# Patient Record
Sex: Female | Born: 1935 | Race: White | Hispanic: No | Marital: Married | Smoking: Never smoker
Health system: Southern US, Community
[De-identification: ages and names within clinical notes are randomized; demographics above are authoritative.]

## PROBLEM LIST (undated history)

## (undated) DIAGNOSIS — C50919 Malignant neoplasm of unspecified site of unspecified female breast: Secondary | ICD-10-CM

## (undated) DIAGNOSIS — E785 Hyperlipidemia, unspecified: Secondary | ICD-10-CM

## (undated) DIAGNOSIS — J45909 Unspecified asthma, uncomplicated: Secondary | ICD-10-CM

## (undated) DIAGNOSIS — Z8719 Personal history of other diseases of the digestive system: Secondary | ICD-10-CM

## (undated) DIAGNOSIS — R269 Unspecified abnormalities of gait and mobility: Secondary | ICD-10-CM

## (undated) DIAGNOSIS — R7309 Other abnormal glucose: Secondary | ICD-10-CM

## (undated) DIAGNOSIS — R32 Unspecified urinary incontinence: Secondary | ICD-10-CM

## (undated) DIAGNOSIS — R609 Edema, unspecified: Secondary | ICD-10-CM

## (undated) DIAGNOSIS — F3289 Other specified depressive episodes: Secondary | ICD-10-CM

## (undated) DIAGNOSIS — F068 Other specified mental disorders due to known physiological condition: Secondary | ICD-10-CM

## (undated) DIAGNOSIS — I872 Venous insufficiency (chronic) (peripheral): Secondary | ICD-10-CM

## (undated) DIAGNOSIS — F329 Major depressive disorder, single episode, unspecified: Secondary | ICD-10-CM

## (undated) DIAGNOSIS — E1149 Type 2 diabetes mellitus with other diabetic neurological complication: Secondary | ICD-10-CM

## (undated) DIAGNOSIS — Z8679 Personal history of other diseases of the circulatory system: Secondary | ICD-10-CM

## (undated) HISTORY — DX: Unspecified abnormalities of gait and mobility: R26.9

## (undated) HISTORY — DX: Personal history of other diseases of the circulatory system: Z86.79

## (undated) HISTORY — PX: OTHER SURGICAL HISTORY: SHX169

## (undated) HISTORY — DX: Hyperlipidemia, unspecified: E78.5

## (undated) HISTORY — DX: Unspecified urinary incontinence: R32

## (undated) HISTORY — DX: Unspecified asthma, uncomplicated: J45.909

## (undated) HISTORY — DX: Venous insufficiency (chronic) (peripheral): I87.2

## (undated) HISTORY — DX: Other abnormal glucose: R73.09

## (undated) HISTORY — DX: Personal history of other diseases of the digestive system: Z87.19

## (undated) HISTORY — DX: Malignant neoplasm of unspecified site of unspecified female breast: C50.919

## (undated) HISTORY — DX: Major depressive disorder, single episode, unspecified: F32.9

## (undated) HISTORY — PX: ABDOMINAL HYSTERECTOMY: SHX81

## (undated) HISTORY — DX: Other specified mental disorders due to known physiological condition: F06.8

## (undated) HISTORY — DX: Edema, unspecified: R60.9

## (undated) HISTORY — DX: Type 2 diabetes mellitus with other diabetic neurological complication: E11.49

## (undated) HISTORY — DX: Other specified depressive episodes: F32.89

## (undated) HISTORY — PX: BREAST SURGERY: SHX581

---

## 2003-09-17 HISTORY — PX: BREAST BIOPSY: SHX20

## 2005-09-16 HISTORY — PX: OTHER SURGICAL HISTORY: SHX169

## 2007-09-17 LAB — HM MAMMOGRAPHY: HM Mammogram: NORMAL

## 2008-11-09 ENCOUNTER — Ambulatory Visit: Payer: Self-pay | Admitting: Oncology

## 2008-12-01 LAB — CBC WITH DIFFERENTIAL/PLATELET
BASO%: 0.7 % (ref 0.0–2.0)
EOS%: 2.2 % (ref 0.0–7.0)
HCT: 42.2 % (ref 34.8–46.6)
LYMPH%: 26.4 % (ref 14.0–49.7)
MCH: 31 pg (ref 25.1–34.0)
MCHC: 34 g/dL (ref 31.5–36.0)
NEUT%: 63.3 % (ref 38.4–76.8)
Platelets: 207 10*3/uL (ref 145–400)
RBC: 4.62 10*6/uL (ref 3.70–5.45)
WBC: 6.4 10*3/uL (ref 3.9–10.3)

## 2008-12-01 LAB — COMPREHENSIVE METABOLIC PANEL
ALT: 11 U/L (ref 0–35)
AST: 19 U/L (ref 0–37)
Albumin: 4 g/dL (ref 3.5–5.2)
Alkaline Phosphatase: 50 U/L (ref 39–117)
Calcium: 9.2 mg/dL (ref 8.4–10.5)
Sodium: 135 mEq/L (ref 135–145)
Total Bilirubin: 0.3 mg/dL (ref 0.3–1.2)

## 2008-12-02 LAB — CANCER ANTIGEN 27.29: CA 27.29: 23 U/mL (ref 0–39)

## 2008-12-02 LAB — VITAMIN D 25 HYDROXY (VIT D DEFICIENCY, FRACTURES): Vit D, 25-Hydroxy: 10 ng/mL — ABNORMAL LOW (ref 30–89)

## 2008-12-29 ENCOUNTER — Ambulatory Visit: Payer: Self-pay | Admitting: Endocrinology

## 2008-12-29 ENCOUNTER — Encounter (INDEPENDENT_AMBULATORY_CARE_PROVIDER_SITE_OTHER): Payer: Self-pay | Admitting: *Deleted

## 2008-12-29 ENCOUNTER — Ambulatory Visit: Payer: Self-pay | Admitting: Internal Medicine

## 2008-12-29 DIAGNOSIS — F329 Major depressive disorder, single episode, unspecified: Secondary | ICD-10-CM

## 2008-12-29 DIAGNOSIS — E785 Hyperlipidemia, unspecified: Secondary | ICD-10-CM

## 2008-12-29 DIAGNOSIS — Z8719 Personal history of other diseases of the digestive system: Secondary | ICD-10-CM

## 2008-12-29 DIAGNOSIS — C50919 Malignant neoplasm of unspecified site of unspecified female breast: Secondary | ICD-10-CM | POA: Insufficient documentation

## 2008-12-29 DIAGNOSIS — J45909 Unspecified asthma, uncomplicated: Secondary | ICD-10-CM

## 2008-12-29 DIAGNOSIS — R32 Unspecified urinary incontinence: Secondary | ICD-10-CM

## 2009-01-05 ENCOUNTER — Telehealth: Payer: Self-pay | Admitting: Internal Medicine

## 2009-01-09 ENCOUNTER — Encounter: Payer: Self-pay | Admitting: Internal Medicine

## 2009-01-24 ENCOUNTER — Ambulatory Visit: Payer: Self-pay | Admitting: Gastroenterology

## 2009-01-25 ENCOUNTER — Telehealth: Payer: Self-pay | Admitting: Gastroenterology

## 2009-01-31 ENCOUNTER — Ambulatory Visit: Payer: Self-pay | Admitting: Internal Medicine

## 2009-02-02 ENCOUNTER — Telehealth: Payer: Self-pay | Admitting: Internal Medicine

## 2009-02-09 ENCOUNTER — Telehealth: Payer: Self-pay | Admitting: Internal Medicine

## 2009-02-14 ENCOUNTER — Encounter: Payer: Self-pay | Admitting: Internal Medicine

## 2009-02-20 ENCOUNTER — Encounter: Admission: RE | Admit: 2009-02-20 | Discharge: 2009-02-20 | Payer: Self-pay | Admitting: Neurology

## 2009-05-24 ENCOUNTER — Ambulatory Visit: Payer: Self-pay | Admitting: Internal Medicine

## 2009-05-24 ENCOUNTER — Telehealth: Payer: Self-pay | Admitting: Internal Medicine

## 2009-05-24 DIAGNOSIS — Z8679 Personal history of other diseases of the circulatory system: Secondary | ICD-10-CM | POA: Insufficient documentation

## 2009-05-24 DIAGNOSIS — R609 Edema, unspecified: Secondary | ICD-10-CM | POA: Insufficient documentation

## 2009-05-24 DIAGNOSIS — F0390 Unspecified dementia without behavioral disturbance: Secondary | ICD-10-CM

## 2009-05-25 ENCOUNTER — Encounter (INDEPENDENT_AMBULATORY_CARE_PROVIDER_SITE_OTHER): Payer: Self-pay | Admitting: *Deleted

## 2009-05-25 LAB — CONVERTED CEMR LAB
AST: 19 units/L (ref 0–37)
Albumin: 3.9 g/dL (ref 3.5–5.2)
Alkaline Phosphatase: 49 units/L (ref 39–117)
Basophils Relative: 0.8 % (ref 0.0–3.0)
Eosinophils Relative: 4.4 % (ref 0.0–5.0)
GFR calc non Af Amer: 74.41 mL/min (ref >60)
HCT: 39.6 % (ref 36.0–46.0)
Hemoglobin: 13.9 g/dL (ref 12.0–15.0)
Lymphs Abs: 1.5 10*3/uL (ref 0.7–4.0)
Monocytes Relative: 10.4 % (ref 3.0–12.0)
Neutro Abs: 3.3 10*3/uL (ref 1.4–7.7)
Potassium: 4.9 meq/L (ref 3.5–5.1)
RBC: 4.36 M/uL (ref 3.87–5.11)
Sodium: 144 meq/L (ref 135–145)
TSH: 2.39 microintl units/mL (ref 0.35–5.50)
WBC: 5.6 10*3/uL (ref 4.5–10.5)

## 2009-06-01 ENCOUNTER — Telehealth: Payer: Self-pay | Admitting: Internal Medicine

## 2009-06-09 ENCOUNTER — Encounter: Admission: RE | Admit: 2009-06-09 | Discharge: 2009-06-09 | Payer: Self-pay | Admitting: Oncology

## 2009-06-30 ENCOUNTER — Ambulatory Visit: Payer: Self-pay | Admitting: Oncology

## 2009-07-04 LAB — CBC WITH DIFFERENTIAL/PLATELET
BASO%: 0.3 % (ref 0.0–2.0)
Eosinophils Absolute: 0.2 10*3/uL (ref 0.0–0.5)
LYMPH%: 27.7 % (ref 14.0–49.7)
MCHC: 34.3 g/dL (ref 31.5–36.0)
MONO#: 0.5 10*3/uL (ref 0.1–0.9)
NEUT#: 3.3 10*3/uL (ref 1.5–6.5)
RBC: 4.46 10*6/uL (ref 3.70–5.45)
RDW: 13.3 % (ref 11.2–14.5)
WBC: 5.6 10*3/uL (ref 3.9–10.3)

## 2009-07-04 LAB — COMPREHENSIVE METABOLIC PANEL
ALT: 12 U/L (ref 0–35)
Albumin: 3.8 g/dL (ref 3.5–5.2)
Alkaline Phosphatase: 44 U/L (ref 39–117)
CO2: 29 mEq/L (ref 19–32)
Glucose, Bld: 107 mg/dL — ABNORMAL HIGH (ref 70–99)
Potassium: 4.1 mEq/L (ref 3.5–5.3)
Sodium: 137 mEq/L (ref 135–145)
Total Bilirubin: 0.4 mg/dL (ref 0.3–1.2)
Total Protein: 6.4 g/dL (ref 6.0–8.3)

## 2009-07-05 LAB — VITAMIN D 25 HYDROXY (VIT D DEFICIENCY, FRACTURES): Vit D, 25-Hydroxy: 35 ng/mL (ref 30–89)

## 2009-08-01 ENCOUNTER — Ambulatory Visit: Payer: Self-pay | Admitting: Internal Medicine

## 2009-08-15 ENCOUNTER — Ambulatory Visit: Payer: Self-pay | Admitting: Psychology

## 2009-12-26 ENCOUNTER — Encounter: Payer: Self-pay | Admitting: Internal Medicine

## 2010-01-29 ENCOUNTER — Ambulatory Visit: Payer: Self-pay | Admitting: Oncology

## 2010-01-30 LAB — COMPREHENSIVE METABOLIC PANEL
ALT: 15 U/L (ref 0–35)
Albumin: 3.8 g/dL (ref 3.5–5.2)
BUN: 11 mg/dL (ref 6–23)
CO2: 25 mEq/L (ref 19–32)
Calcium: 9.1 mg/dL (ref 8.4–10.5)
Chloride: 105 mEq/L (ref 96–112)
Creatinine, Ser: 0.79 mg/dL (ref 0.40–1.20)
Potassium: 4 mEq/L (ref 3.5–5.3)

## 2010-01-30 LAB — CBC WITH DIFFERENTIAL/PLATELET
Basophils Absolute: 0 10*3/uL (ref 0.0–0.1)
HCT: 41.9 % (ref 34.8–46.6)
HGB: 14.3 g/dL (ref 11.6–15.9)
MONO#: 0.6 10*3/uL (ref 0.1–0.9)
NEUT#: 4.2 10*3/uL (ref 1.5–6.5)
NEUT%: 59.1 % (ref 38.4–76.8)
WBC: 7.1 10*3/uL (ref 3.9–10.3)
lymph#: 2 10*3/uL (ref 0.9–3.3)

## 2010-01-31 ENCOUNTER — Ambulatory Visit: Payer: Self-pay | Admitting: Internal Medicine

## 2010-01-31 DIAGNOSIS — R269 Unspecified abnormalities of gait and mobility: Secondary | ICD-10-CM | POA: Insufficient documentation

## 2010-02-26 ENCOUNTER — Ambulatory Visit: Payer: Self-pay | Admitting: Internal Medicine

## 2010-03-02 ENCOUNTER — Encounter: Payer: Self-pay | Admitting: Internal Medicine

## 2010-03-05 ENCOUNTER — Ambulatory Visit: Payer: Self-pay | Admitting: Internal Medicine

## 2010-03-06 ENCOUNTER — Telehealth: Payer: Self-pay | Admitting: Internal Medicine

## 2010-03-06 ENCOUNTER — Ambulatory Visit: Payer: Self-pay | Admitting: Internal Medicine

## 2010-03-06 DIAGNOSIS — M7989 Other specified soft tissue disorders: Secondary | ICD-10-CM | POA: Insufficient documentation

## 2010-03-06 DIAGNOSIS — R9431 Abnormal electrocardiogram [ECG] [EKG]: Secondary | ICD-10-CM | POA: Insufficient documentation

## 2010-03-08 ENCOUNTER — Telehealth (INDEPENDENT_AMBULATORY_CARE_PROVIDER_SITE_OTHER): Payer: Self-pay | Admitting: *Deleted

## 2010-03-20 ENCOUNTER — Encounter: Payer: Self-pay | Admitting: Internal Medicine

## 2010-03-21 ENCOUNTER — Ambulatory Visit: Payer: Self-pay

## 2010-03-21 ENCOUNTER — Ambulatory Visit: Payer: Self-pay | Admitting: Cardiology

## 2010-03-21 ENCOUNTER — Ambulatory Visit (HOSPITAL_COMMUNITY): Admission: RE | Admit: 2010-03-21 | Discharge: 2010-03-21 | Payer: Self-pay | Admitting: Internal Medicine

## 2010-03-21 ENCOUNTER — Ambulatory Visit: Payer: Self-pay | Admitting: Internal Medicine

## 2010-03-27 ENCOUNTER — Encounter: Payer: Self-pay | Admitting: Internal Medicine

## 2010-04-02 ENCOUNTER — Encounter: Payer: Self-pay | Admitting: Internal Medicine

## 2010-04-04 ENCOUNTER — Encounter: Payer: Self-pay | Admitting: Internal Medicine

## 2010-04-05 ENCOUNTER — Telehealth: Payer: Self-pay | Admitting: Internal Medicine

## 2010-04-20 ENCOUNTER — Telehealth: Payer: Self-pay | Admitting: Internal Medicine

## 2010-05-22 ENCOUNTER — Encounter: Payer: Self-pay | Admitting: Internal Medicine

## 2010-06-08 ENCOUNTER — Encounter: Payer: Self-pay | Admitting: Internal Medicine

## 2010-07-03 ENCOUNTER — Ambulatory Visit: Payer: Self-pay | Admitting: Internal Medicine

## 2010-07-04 ENCOUNTER — Encounter: Payer: Self-pay | Admitting: Internal Medicine

## 2010-07-12 ENCOUNTER — Encounter: Payer: Self-pay | Admitting: Internal Medicine

## 2010-08-13 ENCOUNTER — Telehealth: Payer: Self-pay | Admitting: Internal Medicine

## 2010-08-16 ENCOUNTER — Telehealth: Payer: Self-pay | Admitting: Internal Medicine

## 2010-08-17 ENCOUNTER — Encounter: Payer: Self-pay | Admitting: Internal Medicine

## 2010-08-21 ENCOUNTER — Encounter: Payer: Self-pay | Admitting: Internal Medicine

## 2010-09-11 ENCOUNTER — Encounter: Payer: Self-pay | Admitting: Internal Medicine

## 2010-09-21 IMAGING — CR DG CHEST 2V
2 series · 2 of 2 positions shown · non-contrast
Comparison: 12/29/2008

CLINICAL DATA: Shortness of breath, left lower extremity edema and
swelling

CHEST - 2 VIEW

[view not recorded (1 of 2)]
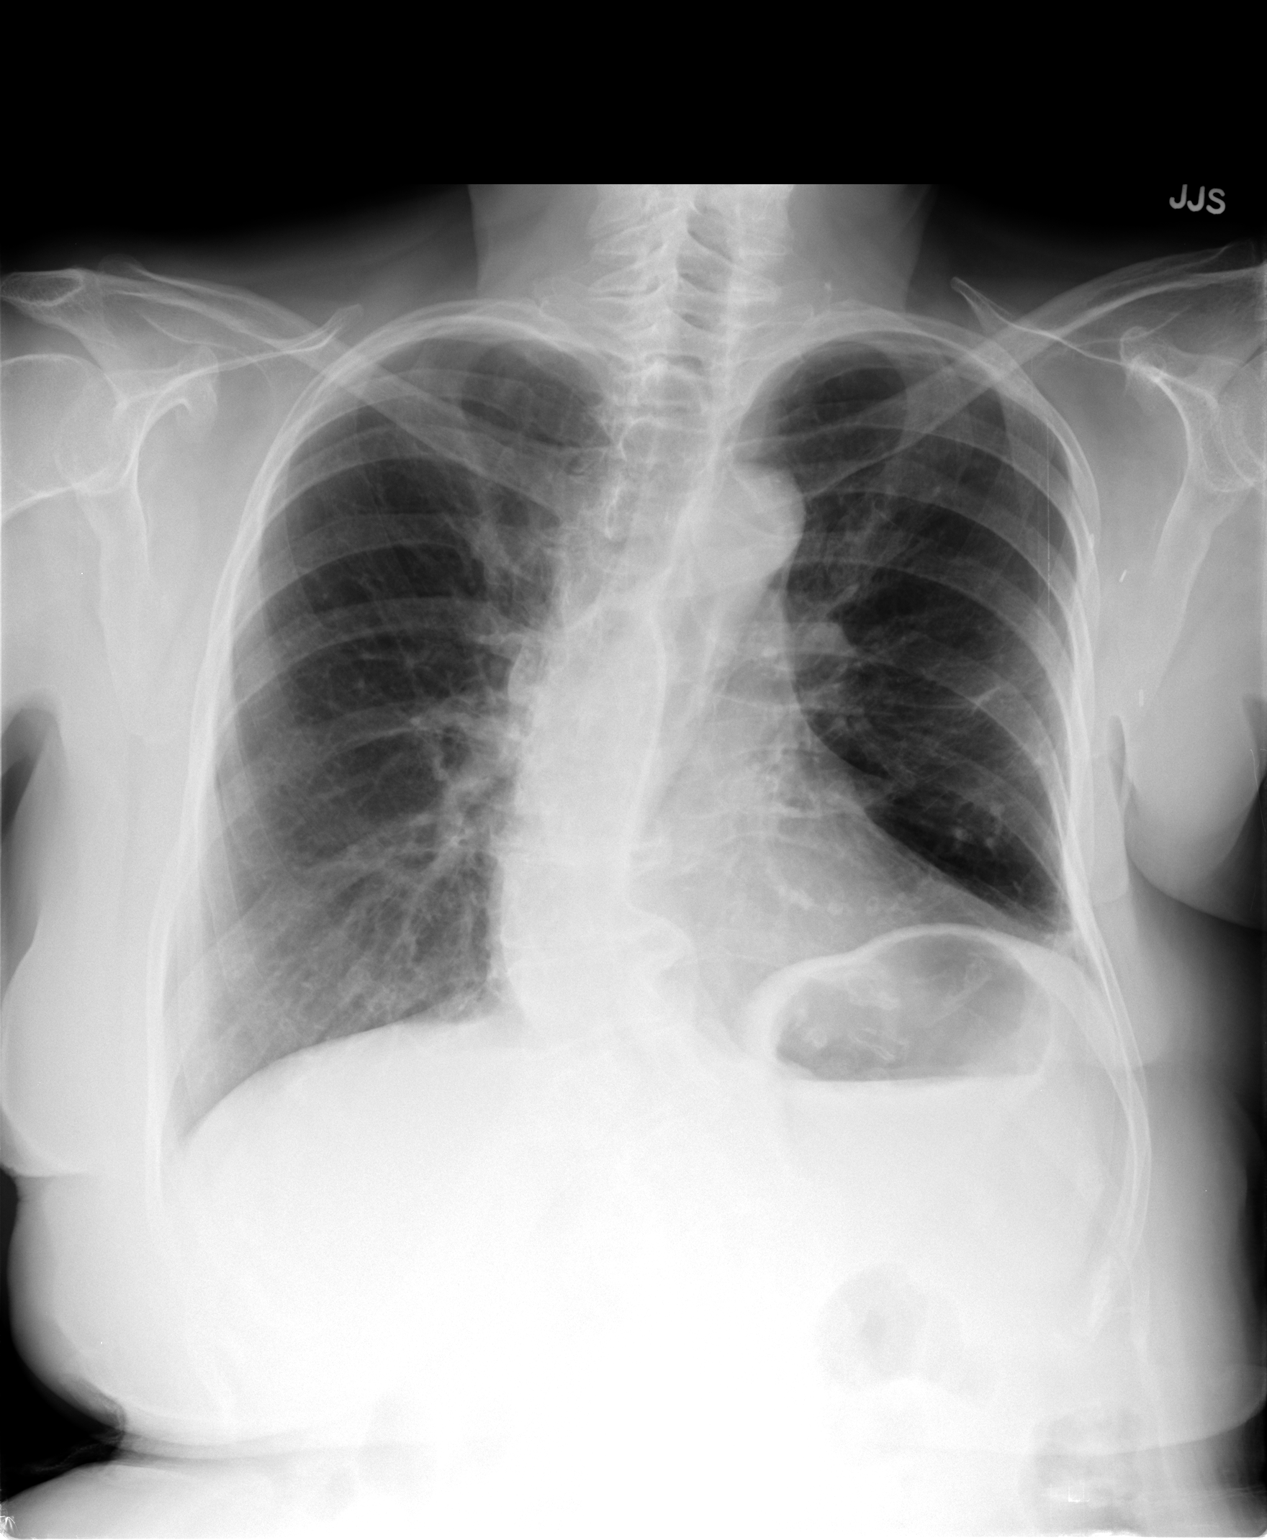

[view not recorded (2 of 2)]
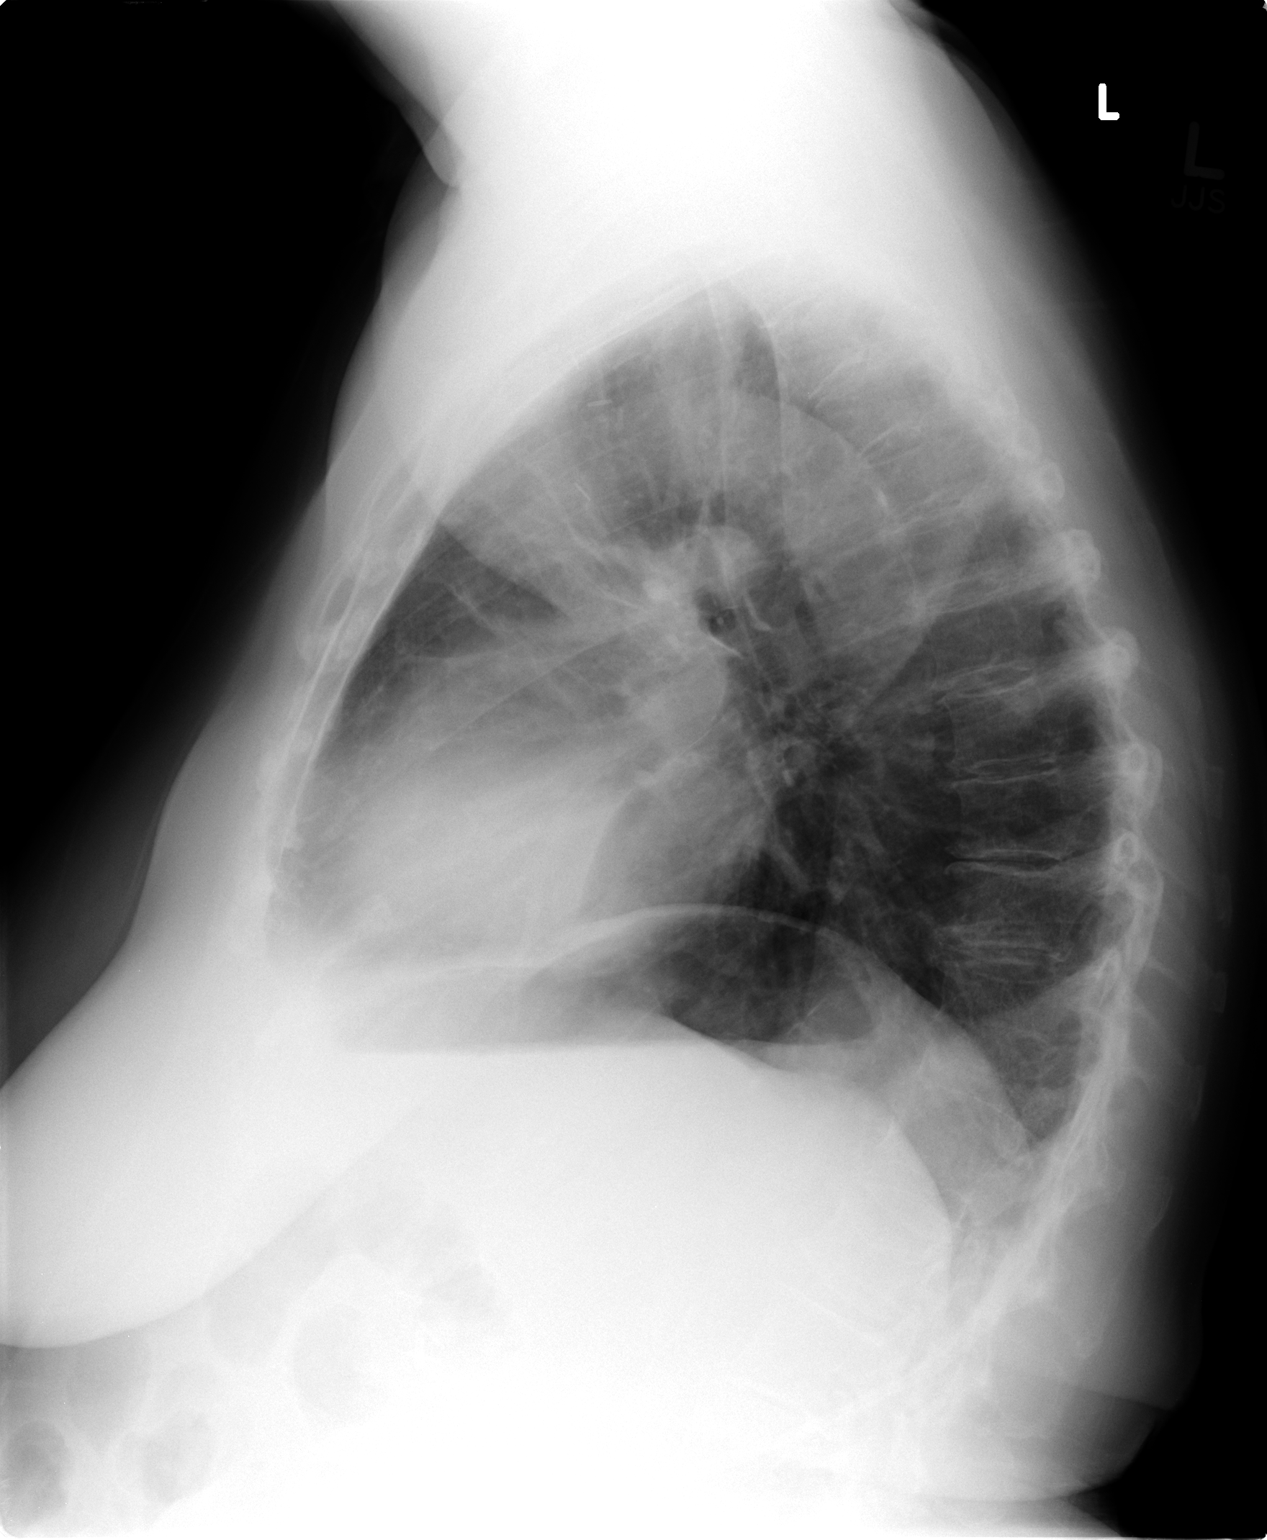

[2 of 2 positions shown; findings below may reference images not displayed]

FINDINGS: Chronic left hemidiaphragmatic elevation and rightward
scoliosis are stable.  Left axillary clips again noted.  Heart size
at upper limits of normal.  No new pulmonary opacity. Bones are
osteopenic, which may mask subtle fracture. No pleural effusion.
Left mid lung zone nodule, presumed granuloma noted, are stable.
IMPRESSION: No new acute finding.

## 2010-10-05 ENCOUNTER — Telehealth: Payer: Self-pay | Admitting: Internal Medicine

## 2010-10-07 ENCOUNTER — Encounter: Payer: Self-pay | Admitting: Oncology

## 2010-10-08 ENCOUNTER — Encounter: Payer: Self-pay | Admitting: Oncology

## 2010-10-14 LAB — CONVERTED CEMR LAB
ALT: 12 units/L (ref 0–35)
AST: 22 units/L (ref 0–37)
Alkaline Phosphatase: 59 units/L (ref 39–117)
Alkaline Phosphatase: 62 units/L (ref 39–117)
BUN: 10 mg/dL (ref 6–23)
Basophils Absolute: 0 10*3/uL (ref 0.0–0.1)
Basophils Relative: 0.7 % (ref 0.0–3.0)
Bilirubin Urine: NEGATIVE
Bilirubin, Direct: 0.1 mg/dL (ref 0.0–0.3)
CO2: 33 meq/L — ABNORMAL HIGH (ref 19–32)
Calcium: 9.3 mg/dL (ref 8.4–10.5)
Calcium: 9.7 mg/dL (ref 8.4–10.5)
Cholesterol: 184 mg/dL (ref 0–200)
Creatinine, Ser: 0.8 mg/dL (ref 0.4–1.2)
Creatinine, Ser: 0.8 mg/dL (ref 0.4–1.2)
Eosinophils Absolute: 0.1 10*3/uL (ref 0.0–0.7)
Eosinophils Relative: 2.5 % (ref 0.0–5.0)
HCT: 43.3 % (ref 36.0–46.0)
Hemoglobin: 14.6 g/dL (ref 12.0–15.0)
Lymphocytes Relative: 24.8 % (ref 12.0–46.0)
MCHC: 33.8 g/dL (ref 30.0–36.0)
MCHC: 34 g/dL (ref 30.0–36.0)
Magnesium: 2.2 mg/dL (ref 1.5–2.5)
Neutro Abs: 5 10*3/uL (ref 1.4–7.7)
Neutrophils Relative %: 63.7 % (ref 43.0–77.0)
Nitrite: NEGATIVE
Pap Smear: NORMAL
Platelets: 184 10*3/uL (ref 150.0–400.0)
Potassium: 4.6 meq/L (ref 3.5–5.1)
RBC: 4.47 M/uL (ref 3.87–5.11)
RBC: 4.72 M/uL (ref 3.87–5.11)
RDW: 12.8 % (ref 11.5–14.6)
RDW: 14.2 % (ref 11.5–14.6)
Specific Gravity, Urine: >=1.03 (ref 1.000–1.030)
Total Bilirubin: 0.3 mg/dL (ref 0.3–1.2)
Total Bilirubin: 0.6 mg/dL (ref 0.3–1.2)
Total Protein: 7.2 g/dL (ref 6.0–8.3)
WBC: 7.2 10*3/uL (ref 4.5–10.5)
pH: 5 (ref 5.0–8.0)

## 2010-10-15 ENCOUNTER — Telehealth: Payer: Self-pay | Admitting: Internal Medicine

## 2010-10-18 NOTE — Progress Notes (Signed)
Summary: Lasix?  Phone Note Call from Patient   Caller: Daughter [phone] Summary of Call: Pt's daughter called stating that swelling in pt's legs have gotten worse. Daughter is requesting an increase in Lasix. Daughter also request Paxil be sent to [facility]. (done 01/31/2010) Initial call taken by: Margaret Pyle, CMA,  March 06, 2010 2:34 PM  Follow-up for Phone Call        ok - increase lasix to once daily - new rx sent to local pharm for same - 90d paxil refill to [facility] done 5/18 (as noted) Follow-up by: Newt Lukes MD,  March 06, 2010 2:43 PM  Additional Follow-up for Phone Call Additional follow up Details #1::        pt's original Rx if for Lasix 20mg  1 by mouth once daily. Okay to increase? Additional Follow-up by: Margaret Pyle, CMA,  March 06, 2010 2:51 PM    Additional Follow-up for Phone Call Additional follow up Details #2::    prior rx was for every other day --- pt now on my schedule for this afternoon - will address at OV - thanks Follow-up by: Newt Lukes MD,  March 06, 2010 3:07 PM  New/Updated Medications: FUROSEMIDE 20 MG TABS (FUROSEMIDE) 1 tab by mouth every day as needed for feet swelling Prescriptions: FUROSEMIDE 20 MG TABS (FUROSEMIDE) 1 tab by mouth every day as needed for feet swelling  #30 x 3   Entered and Authorized by:   Newt Lukes MD   Signed by:   Newt Lukes MD on 03/06/2010   Method used:   Electronically to        [facility]  [phone]* (retail)       [address]       [place], [place]  [address]       Ph: [phone] or [phone]       Fax: [phone]   RxID:   [id number]

## 2010-10-18 NOTE — Progress Notes (Signed)
Summary: vitamin d3  Phone Note Refill Request Message from:  Fax from Pharmacy on August 13, 2010 11:29 AM  Refills Requested: Medication #1:  VITAMIN D 1000 UNIT TABS take 1 by mouth qd   Last Refilled: 07/10/2010 Fax from [facility] [phone] Last ov 07/03/10   Method Requested: Electronic Next Appointment Scheduled: none Initial call taken by: Orlan Leavens RMA,  August 13, 2010 11:29 AM    New/Updated Medications: VITAMIN D3 1000 UNIT TABS (CHOLECALCIFEROL) Take 2 by mouth once daily Prescriptions: VITAMIN D3 1000 UNIT TABS (CHOLECALCIFEROL) Take 2 by mouth once daily  #60 x 6   Entered by:   Orlan Leavens RMA   Authorized by:   Newt Lukes MD   Signed by:   Orlan Leavens RMA on 08/13/2010   Method used:   Electronically to        [facility]* (retail)       [address]       [place], [place]  [address]       Ph: [phone]       Fax: [phone]   RxID:   [id number]

## 2010-10-18 NOTE — Assessment & Plan Note (Signed)
Summary: TB TEST / [facility] Natale Milch  Nurse Visit  CC: TB skin test--Right forearm./kb   Allergies: 1)  ! Codeine  Immunizations Administered:  PPD Skin Test:    Vaccine Type: PPD    Site: left forearm    Mfr: Sanofi Pasteur    Dose: 0.1 ml    Route: ID    Given by: Lucious Groves    Exp. Date: 02/11/2012    Lot #: [id number]  Orders Added: 1)  TB Skin Test [86580] 2)  Admin 1st Vaccine [90471]   Received fax from [facility] that TB was negative on 03-23-10. Lucious Groves CMA  April 09, 2010 10:25 AM

## 2010-10-18 NOTE — Miscellaneous (Signed)
Summary: Plan/[facility]  Plan/[facility]   Imported By: Lester [place]land 02/28/2010 10:57:59  _____________________________________________________________________  External Attachment:    Type:   Image     Comment:   External Document

## 2010-10-18 NOTE — Miscellaneous (Signed)
Summary: Order/[facility]  Order/[facility]   Imported By: Lester [place] 03/29/2010 08:17:57  _____________________________________________________________________  External Attachment:    Type:   Image     Comment:   External Document

## 2010-10-18 NOTE — Miscellaneous (Signed)
Summary: Medication orders/[facility]  Medication orders/ Place   Imported By: Sherian Rein 08/23/2010 11:43:26  _____________________________________________________________________  External Attachment:    Type:   Image     Comment:   External Document

## 2010-10-18 NOTE — Miscellaneous (Signed)
Summary: Medication order/[facility]  Medication order/[facility]   Imported By: Sherian Rein 07/16/2010 07:36:19  _____________________________________________________________________  External Attachment:    Type:   Image     Comment:   External Document

## 2010-10-18 NOTE — Assessment & Plan Note (Signed)
Summary: FEET ARE SWELLING/[facility]   Vital Signs:  Patient profile:   77 year old female Height:      58 inches (147.32 cm) Weight:      142.8 pounds (65.05 kg) O2 Sat:      91 % on Room air Temp:     97.2 degrees F (36.22 degrees C) oral Pulse rate:   94 / minute BP sitting:   132 / 60  (right arm) Cuff size:   regular  Vitals Entered By: Orlan Leavens (March 06, 2010 3:39 PM)  O2 Flow:  Room air CC: swelling in her feet Comments Req rx for paroxetine to be sent to [facility]. daughter states no loner want to get from local pharm   Primary Care Provider:  Newt Lukes MD  CC:  swelling in her feet.  History of Present Illness: here for edema - chronic but getting worse even since OV 1 month ago- weight up only a little - L>R leg swelling "like it might pop" not improving with Lasix every other day or once daily  +DOE but no CP or cough - no medication changes no hx CHF or DVT  reveiw of 01/31/10 OV today: 1) dementia - following with local neuro and [facility] - recent PET scan showing "regular" ALz typenow on aricept for same and tol well - no adv se family preparing for ALF needs - planning to move in mid Jul 2011 reports on generic paxil, overall irritability is much improved  2) breast ca hx - 5 years ca free this Sept - plans to cont med tx thru 01/2010 (57yr post chemo done) - now clear to stop tamoxifen folowing with onc for same  3) weight gain - over 20 lbs since moving here - pt attributes to antidepressant meds - reports she does not participate in any exercise - diet predominately simple carbs and sweets no bowel changes  4) ankle edema - kept under control with every other day lasix - no leg cramping - no SOB  5) depression - see dementia above - reports compliance with ongoing medical treatment and no changes in medication dose or frequency. denies adverse side effects related to current therapy.  less irritable, less sad, no tearfulness - family  agrees    Clinical Review Panels:  Immunizations   Last Tetanus Booster:  Td (12/29/2008)   Last Flu Vaccine:  Historical (06/16/2009)   Last Pneumovax:  Historical (09/17/2007)  Lipid Management   Cholesterol:  184 (12/29/2008)   LDL (bad choesterol):  116 (12/29/2008)   HDL (good cholesterol):  52.70 (12/29/2008)  CBC   WBC:  5.6 (05/24/2009)   RBC:  4.36 (05/24/2009)   Hgb:  13.9 (05/24/2009)   Hct:  39.6 (05/24/2009)   Platelets:  189.0 (05/24/2009)   MCV  90.8 (05/24/2009)   MCHC  35.1 (05/24/2009)   RDW  12.9 (05/24/2009)   PMN:  57.4 (05/24/2009)   Lymphs:  27.0 (05/24/2009)   Monos:  10.4 (05/24/2009)   Eosinophils:  4.4 (05/24/2009)   Basophil:  0.8 (05/24/2009)  Complete Metabolic Panel   Glucose:  92 (05/24/2009)   Sodium:  144 (05/24/2009)   Potassium:  4.9 (05/24/2009)   Chloride:  107 (05/24/2009)   CO2:  30 (05/24/2009)   BUN:  10 (05/24/2009)   Creatinine:  0.8 (05/24/2009)   Albumin:  3.9 (05/24/2009)   Total Protein:  6.6 (05/24/2009)   Calcium:  9.2 (05/24/2009)   Total Bili:  0.7 (05/24/2009)   Alk Phos:  49 (05/24/2009)   SGPT (ALT):  10 (05/24/2009)   SGOT (AST):  19 (05/24/2009)   Allergies: 1)  ! Codeine  Past History:  Past medical, surgical, family and social histories (including risk factors) reviewed, and no changes noted (except as noted below).  Past Medical History: dementia - alzheimers breast cancer - 05/31/2004 - 2 lumps removed L breast, s/p XRT and chemo  TIA - 2005 ITP -1991 Asthma Depression Diverticulitis, hx of dyslipidemia - declined med tx   MD roster: onc - [facility] - [facility]  Past Surgical History: Reviewed history from 12/29/2008 and no changes required. Cholecystectomy (6789) Hysterectomy (1972) Breast biopsy (2005) Urinary sling (2007) ITP (1991 [facility])  Family History: Reviewed history from 12/29/2008 and no changes  required. Family History Breast cancer 1st degree relative <50 (mother) Family History Kidney disease (mother) Family History of Prostate CA 1st degree relative <50 (parent) Family History of Stroke F 1st degree relative <60 (mother) Heart disease (father) 2 uncle commited suicide Father electric shock therapy  Social History: Reviewed history from 12/29/2008 and no changes required. lives in [place] with spouse. Remote tobacco No alcohol use.  Review of Systems  The patient denies anorexia, chest pain, syncope, headaches, abdominal pain, incontinence, and difficulty walking.         also, see HPI above. I have reviewed all other systems and they were negative.   Physical Exam  General:  alert, well-developed, well-nourished, and cooperative to examination.   dtr at side Lungs:  normal respiratory effort with moderate air mvmt, no intercostal retractions or use of accessory muscles; normal breath sounds bilaterally - no crackles and no wheezes.    Heart:  normal rate, regular rhythm, no murmur, and no rub. BLE with ++ edema, L>R. normal DP pulses and normal cap refill in all 4 extremities    Skin:  red line of cracking skin line due to edema at left lateral ankle - no ulceration or cellulitis Psych:  Oriented X3, memory intact for recent < remote, normally interactive, good eye contact, not anxious appearing, not depressed appearing, and not agitated.      Impression & Recommendations:  Problem # 1:  EDEMA (ICD-782.3)  increase Lasix +add potassium while eval for underlying cause - check labs, CXR, EKG r/o cardiac event or CHF check doppler r/o DVT, esp with cancer hx Her updated medication list for this problem includes:    Lasix 40 Mg Tabs (Furosemide) .Marland Kitchen... 1 by mouth two times a day x 3 days, then 1 by mouth once daily (or as directed)  Orders: EKG w/ Interpretation (93000) T-2 View CXR (71020TC) TLB-BMP (Basic Metabolic Panel-BMET) (80048-METABOL) TLB-CBC Platelet  - w/Differential (85025-CBCD) TLB-Hepatic/Liver Function Pnl (80076-HEPATIC) TLB-TSH (Thyroid Stimulating Hormone) (84443-TSH) TLB-BNP (B-Natriuretic Peptide) (83880-BNPR) Misc. Referral (Misc. Ref) Echo Referral (Echo) TLB-Magnesium (Mg) (83735-MG) Prescription Created Electronically [phone])  note EKG today shows new prolonged QTc as compared to 12/29/2008 EKG - no offending meds on current list - check lytes as above and cont eval for CHF or structural heart dz as planned  Problem # 2:  NONSPECIFIC ABNORMAL ELECTROCARDIOGRAM (ICD-794.31) new prolonged QTC since 12/29/2008 EKG - see discussion above workup and eval as discussed above Orders: TLB-Magnesium (Mg) (83735-MG)  Problem # 3:  SWELLING OF LIMB (ICD-729.81)  Orders: TLB-BMP (Basic Metabolic Panel-BMET) (80048-METABOL) TLB-CBC Platelet - w/Differential (85025-CBCD) TLB-Hepatic/Liver Function Pnl (80076-HEPATIC) TLB-TSH (Thyroid Stimulating Hormone) (84443-TSH) TLB-BNP (B-Natriuretic Peptide) (83880-BNPR) Misc. Referral (Misc. Ref) Echo Referral (Echo) Prescription  Created Electronically [phone])  Problem # 4:  DEPRESSION (ICD-311) reviewed for poss SE - not on TCAs or known to cause above problems refill to [facility] as discussed symptoms including dementia symptoms are much improved with tx as ongoing - cont same Her updated medication list for this problem includes:    Paroxetine Hcl 20 Mg Tabs (Paroxetine hcl) .Marland Kitchen... 1 by mouth once daily    Remeron 30 Mg Tabs (Mirtazapine) .Marland Kitchen... Take 2 at bedtime  Complete Medication List: 1)  Nitrofurantoin Macrocrystal 50 Mg Caps (Nitrofurantoin macrocrystal) .Marland Kitchen.. 1 by mouth at bedtime 2)  Meclizine Hcl 25 Mg Tabs (Meclizine hcl) .Marland Kitchen.. 1 by mouth at bedtime 3)  Gabapentin 100 Mg Caps (Gabapentin) .Marland Kitchen.. 1 by mouth at bedtime 4)  Caltrate 600+d Plus 600-400 Mg-unit Tabs (Calcium carbonate-vit d-min) .Marland Kitchen.. 1 by mouth once daily 5)  Paroxetine Hcl 20 Mg Tabs (Paroxetine hcl) .Marland Kitchen.. 1 by mouth  once daily 6)  Aspirin 81 Mg Tabs (Aspirin) .... Take 1 by mouth qd 7)  Remeron 30 Mg Tabs (Mirtazapine) .... Take 2 at bedtime 8)  Vitamin D 2000 Unit Tabs (Cholecalciferol) .... Take 1 by mouth qd 9)  Aricept 10 Mg Tabs (Donepezil hcl) .... Take 1 by mouth once daily 10)  Lasix 40 Mg Tabs (Furosemide) .Marland Kitchen.. 1 by mouth two times a day x 3 days, then 1 by mouth once daily (or as directed) 11)  Klor-con M20 20 Meq Cr-tabs (Potassium chloride crys cr) .Marland Kitchen.. 1 by mouth two times a day x 3 days, then 1 by mouth once daily or as directed (take with every 40mg  lasix dose)  Patient Instructions: 1)  it was good to see you today. 2)  increase lasix and add potassium as discussed - see below - your prescriptions have been electronically submitted to your local pharmacy. Please take as directed. Contact our office if you believe you're having problems with the medication(s).  3)  test(s) ordered today - your results will be posted on the phone tree for review in 48-72 hours from the time of test completion; call [phone] and enter your 9 digit MRN (listed above on this page, just below your name); if any changes need to be made or there are abnormal results, you will be contacted directly.  4)  we'll make referral for doppler to look for blood clot and echocardiogram to look at heart pump function. Our office will contact you regarding this appointment once made.  Prescriptions: KLOR-CON M20 20 MEQ CR-TABS (POTASSIUM CHLORIDE CRYS CR) 1 by mouth two times a day x 3 days, then 1 by mouth once daily or as directed (take with every 40mg  lasix dose)  #40 x 1   Entered and Authorized by:   Newt Lukes MD   Signed by:   Newt Lukes MD on 03/06/2010   Method used:   Electronically to        [facility]  [phone]* (retail)       [address]       [place], [place]  [address]       Ph: [phone] or [phone]       Fax: [phone]   RxID:    [id number] LASIX 40 MG TABS (FUROSEMIDE) 1 by mouth two times a day x 3 days, then 1 by mouth once daily (or as directed)  #40 x 1   Entered and Authorized by:   Newt Lukes MD  Signed by:   Newt Lukes MD on 03/06/2010   Method used:   Electronically to        [facility]  [phone]* (retail)       [address]       [place], [place]  [address]       Ph: [phone] or [phone]       Fax: [phone]   RxID:   [id number] PAROXETINE HCL 20 MG TABS (PAROXETINE HCL) 1 by mouth once daily  #90 x 3   Entered by:   Orlan Leavens   Authorized by:   Newt Lukes MD   Signed by:   Orlan Leavens on 03/06/2010   Method used:   Faxed to ...       [facility] (mail-order)             , [place]         Ph: [phone]       Fax: [phone]   RxID:   [id number]

## 2010-10-18 NOTE — Miscellaneous (Signed)
Summary: Orders/[facility]  Orders/[facility]   Imported By: Lester Tiffin 04/06/2010 09:52:37  _____________________________________________________________________  External Attachment:    Type:   Image     Comment:   External Document

## 2010-10-18 NOTE — Assessment & Plan Note (Signed)
Summary: ASTHMA  PER DAHLIA SCHED  STC   Vital Signs:  Patient profile:   77 year old female Height:      58 inches (147.32 cm) Weight:      160.4 pounds (72.91 kg) O2 Sat:      94 % on Room air Temp:     98.0 degrees F (36.67 degrees C) oral Pulse rate:   113 / minute BP sitting:   142 / 72  (right arm) Cuff size:   regular  Vitals Entered By: Orlan Leavens RMA (July 03, 2010 9:35 AM)  O2 Flow:  Room air CC: Asthma Is Patient Diabetic? No Pain Assessment Patient in pain? no        Primary Care Provider:  Newt Lukes MD  CC:  Asthma.  History of Present Illness: c/o asthma flare: hx same prior to move to [place] (2008)  onset current symptoms 1 week ago a/w SOB, DOE and wheeze not a/w fever or sputum, no PND not changed by position but worse with exertion  also reviewed chronic med issues: 1) dementia - following with local neuro and [facility] - recent PET scan showing "regular" ALz type, now on aricept for same and tol well - no adv se in ALF since Jul 2011 reports on generic paxil, overall irritability is much improved  2) breast ca hx - 5 years ca free this Sept - plans to cont med tx thru 01/2010 (27yr post chemo done) - now clear to stop tamoxifen folowing with onc for same  3) weight gain - over 30 lbs since moving here - pt attributes to antidepressant meds - reports she does not participate in any exercise - diet predominately simple carbs and sweets no bowel changes  4) ankle edema - kept under control with lasix - eval summer 2011 neg for cardiac or liver or kidney problems -no leg cramping - no SOB  5) depression - see dementia above - reports compliance with ongoing medical treatment and no changes in medication dose or frequency. denies adverse side effects related to current therapy.  less irritable, less sad, no tearfulness - family agrees    Clinical Review Panels:  Immunizations   Last Tetanus Booster:  Td (12/29/2008)   Last Flu Vaccine:   Historical (06/16/2009)   Last Pneumovax:  Historical (09/17/2007)  CBC   WBC:  7.3 (03/06/2010)   RBC:  4.47 (03/06/2010)   Hgb:  13.9 (03/06/2010)   Hct:  40.7 (03/06/2010)   Platelets:  211.0 (03/06/2010)   MCV  91.1 (03/06/2010)   MCHC  34.0 (03/06/2010)   RDW  14.2 (03/06/2010)   PMN:  63.7 (03/06/2010)   Lymphs:  24.8 (03/06/2010)   Monos:  8.7 (03/06/2010)   Eosinophils:  2.1 (03/06/2010)   Basophil:  0.7 (03/06/2010)  Complete Metabolic Panel   Glucose:  115 (03/06/2010)   Sodium:  144 (03/06/2010)   Potassium:  4.0 (03/06/2010)   Chloride:  103 (03/06/2010)   CO2:  33 (03/06/2010)   BUN:  15 (03/06/2010)   Creatinine:  0.8 (03/06/2010)   Albumin:  3.8 (03/06/2010)   Total Protein:  6.9 (03/06/2010)   Calcium:  9.3 (03/06/2010)   Total Bili:  0.3 (03/06/2010)   Alk Phos:  62 (03/06/2010)   SGPT (ALT):  12 (03/06/2010)   SGOT (AST):  18 (03/06/2010)   Current Medications (verified): 1)  Nitrofurantoin Macrocrystal 50 Mg Caps (Nitrofurantoin Macrocrystal) .Marland Kitchen.. 1 By Mouth At Bedtime 2)  Meclizine Hcl 25 Mg Tabs (Meclizine  Hcl) .... 1 By Mouth At Bedtime 3)  Caltrate 600+d Plus 600-400 Mg-Unit Tabs (Calcium Carbonate-Vit D-Min) .Marland Kitchen.. 1 By Mouth Once Daily 4)  Paroxetine Hcl 20 Mg Tabs (Paroxetine Hcl) .Marland Kitchen.. 1 By Mouth Once Daily 5)  Aspirin 81 Mg Tabs (Aspirin) .... Take 1 By Mouth Qd 6)  Remeron 30 Mg Tabs (Mirtazapine) .... Take 2 At Bedtime 7)  Vitamin D 2000 Unit Tabs (Cholecalciferol) .... Take 1 By Mouth Qd 8)  Aricept 10 Mg Tabs (Donepezil Hcl) .... Take 1 By Mouth Once Daily 9)  Lasix 40 Mg Tabs (Furosemide) .Marland Kitchen.. 1 By Mouth Once Daily (Or As Directed) 10)  Klor-Con M20 20 Meq Cr-Tabs (Potassium Chloride Crys Cr) .Marland Kitchen.. 1 By Mouth Once Daily or As Directed (Take With Every 40mg  Lasix Dose) 11)  Gabapentin 300 Mg Caps (Gabapentin) .... Take 1 At Bedtime  Allergies (verified): 1)  ! Codeine  Past History:  Past Medical History: dementia - alzheimers breast  cancer - 05/31/2004 - 2 lumps removed L breast, s/p XRT and chemo  TIA - 2005 ITP -1991 Asthma Depression Diverticulitis, hx of dyslipidemia - declined med tx   MD roster:  onc - [facility] neuro - [facility]  Review of Systems       The patient complains of weight gain and peripheral edema.  The patient denies anorexia, fever, chest pain, and hemoptysis.    Physical Exam  General:  alert, well-developed, well-nourished, and cooperative to examination.   dtr at side Lungs:  normal respiratory effort with moderate air mvmt, no intercostal retractions or use of accessory muscles; good breath sounds bilaterally - no crackles but soft end exp wheezes.    Heart:  normal rate, regular rhythm, no murmur, and no rub. BLE with trace edema, L>R.    Impression & Recommendations:  Problem # 1:  ASTHMA (ICD-493.90)  acute exac, mild - prev on maintence steroids and as needed duoneb  change to MDI now and resume tx -  no signs infx at this time but to call if fever or sputum or not improved consider need for PFTs in future if cont symptoms  Her updated medication list for this problem includes:    Qvar 80 Mcg/act Aers (Beclomethasone dipropionate) .Marland Kitchen... 2 pufss two times a day everyday    Combivent 18-103 Mcg/act Aero (Ipratropium-albuterol) .Marland Kitchen... 2 puffs three times a day x 7 days, then as needed for shortness of breath symptoms  Pulmonary Functions Reviewed: O2 sat: 94 (07/03/2010)  Problem # 2:  DEMENTIA (ICD-294.8)  cont ongoing care and w/u med mgmt by neuro -  symptoms mgmt with paxil and remeron reviewed, now on aricept as well- support offered suggested reduction in remeron dose esp with edema and cont weight gain - dtr will discuss with neuro before making med changes  Complete Medication List: 1)  Nitrofurantoin Macrocrystal 50 Mg Caps (Nitrofurantoin macrocrystal) .Marland Kitchen.. 1 by mouth at bedtime 2)  Meclizine Hcl 25 Mg Tabs (Meclizine hcl) .Marland Kitchen.. 1 by mouth at bedtime 3)   Caltrate 600+d Plus 600-400 Mg-unit Tabs (Calcium carbonate-vit d-min) .Marland Kitchen.. 1 by mouth once daily 4)  Paroxetine Hcl 20 Mg Tabs (Paroxetine hcl) .Marland Kitchen.. 1 by mouth once daily 5)  Aspirin 81 Mg Tabs (Aspirin) .... Take 1 by mouth qd 6)  Remeron 30 Mg Tabs (Mirtazapine) .... Take 2 at bedtime 7)  Vitamin D 2000 Unit Tabs (Cholecalciferol) .... Take 1 by mouth qd 8)  Aricept 10 Mg Tabs (Donepezil hcl) .... Take 1 by mouth once  daily 9)  Lasix 40 Mg Tabs (Furosemide) .Marland Kitchen.. 1 by mouth once daily (or as directed) 10)  Klor-con M20 20 Meq Cr-tabs (Potassium chloride crys cr) .Marland Kitchen.. 1 by mouth once daily or as directed (take with every 40mg  lasix dose) 11)  Gabapentin 300 Mg Caps (Gabapentin) .... Take 1 at bedtime 12)  Qvar 80 Mcg/act Aers (Beclomethasone dipropionate) .... 2 pufss two times a day everyday 13)  Combivent 18-103 Mcg/act Aero (Ipratropium-albuterol) .... 2 puffs three times a day x 7 days, then as needed for shortness of breath symptoms  Patient Instructions: 1)  it was good to see you today. 2)  start Qvar (like old pink inhaler) and Combive nt (like old nebulizer  medication) - your prescriptions have been electronically submitted to [facility] AND given to you to submit to your local pharmacy. Please take as directed. Contact our office if you believe you're having problems with the medication(s).  3)  You should keep both these inhalers in your room to use as we discussed - please let your facility know i have ordered same and to contact me if they have questions or concerns 4)  if you develop worsening symptoms or fever, call us and we can reconsider antibiotics but it does not appear necessary to use any anitbiotic at this time  5)  discuss with neuro about possible reduction in remeron due to swelling and weight gain - ?other medication to use 6)  Please schedule a follow-up appointment in 3-6 months, sooner if problems.  Prescriptions: COMBIVENT 18-103 MCG/ACT AERO (IPRATROPIUM-ALBUTEROL) 2  puffs three times a day x 7 days, then as needed for shortness of breath symptoms  #3 x 3   Entered and Authorized by:   Newt Lukes MD   Signed by:   Newt Lukes MD on 07/03/2010   Method used:   Faxed to ...       [facility] (mail-order)             , [place]         Ph: [phone]       Fax: [phone]   RxID:   [id number] QVAR 80 MCG/ACT AERS (BECLOMETHASONE DIPROPIONATE) 2 pufss two times a day everyday  #3 x 3   Entered and Authorized by:   Newt Lukes MD   Signed by:   Newt Lukes MD on 07/03/2010   Method used:   Faxed to ...       [facility] (mail-order)             , [place]         Ph: [phone]       Fax: [phone]   RxID:   [id number] COMBIVENT 18-103 MCG/ACT AERO (IPRATROPIUM-ALBUTEROL) 2 puffs three times a day x 7 days, then as needed for shortness of breath symptoms  #1 x 1   Entered and Authorized by:   Newt Lukes MD   Signed by:   Newt Lukes MD on 07/03/2010   Method used:   Print then Give to Patient   RxID:   [id number] QVAR 80 MCG/ACT AERS (BECLOMETHASONE DIPROPIONATE) 2 pufss two times a day everyday  #1 x 1   Entered and Authorized by:   Newt Lukes MD   Signed by:   Newt Lukes MD on 07/03/2010   Method used:   Print then Give to Patient   RxID:   [id number]    Orders Added: 1)  Est. Patient  Level IV [id number]

## 2010-10-18 NOTE — Miscellaneous (Signed)
Summary: Resident Register & inof/[facility]  Resident Register [facility] inof/[facility]   Imported By: Sherian Rein 09/18/2010 14:23:28  _____________________________________________________________________  External Attachment:    Type:   Image     Comment:   External Document

## 2010-10-18 NOTE — Miscellaneous (Signed)
Summary: Orders Update  Clinical Lists Changes  Orders: Added new Test order of Venous Duplex Lower Extremity (Venous Duplex Lower) - Signed 

## 2010-10-18 NOTE — Assessment & Plan Note (Signed)
Summary: 6 mos f/u per daughter/#?cd   Vital Signs:  Patient profile:   77 year old female Height:      58 inches (147.32 cm) Weight:      142.8 pounds (64.91 kg) BMI:     29.95 O2 Sat:      94 % on Room air Temp:     98.1 degrees F (36.72 degrees C) oral Pulse rate:   99 / minute BP sitting:   134 / 72  (right arm) Cuff size:   regular  Vitals Entered By: Orlan Leavens (Jan 31, 2010 9:57 AM)  O2 Flow:  Room air CC: 6 month follow-up/ Need 90 day on meclizine & furosemide Is Patient Diabetic? No Pain Assessment Patient in pain? no        Primary Care Provider:  Newt Lukes MD  CC:  6 month follow-up/ Need 90 day on meclizine & furosemide.  History of Present Illness: here for followup  1) dementia - following with local neuro and [facility] - recent PET scan showing "regular" ALz typenow on aricept for same and tol well - no adv se family preparing for ALF needs - ?increase generic paxil dose though overall irritability is much improved  2) breast ca hx - 5 years ca free this Sept - plans to cont med tx thru 01/2010 (13yr post chemo done) - now clear to stop tamoxifen folowing with onc for same  3) weight gain - over 20 lbs since moving here - pt attributes to antidepressant meds - reports she does not participate in any exercise - diet predominately simple carbs and sweets no bowel changes  4) ankle edema - kept under control with every other day lasix - no leg cramping - no SOB  5) depression - see dementia above - reports compliance with ongoing medical treatment and no changes in medication dose or frequency. denies adverse side effects related to current therapy.  less irritable, less sad, no tearfulness - family agrees    Clinical Review Panels:  Immunizations   Last Tetanus Booster:  Td (12/29/2008)   Last Flu Vaccine:  Historical (06/16/2009)   Last Pneumovax:  Historical (09/17/2007)  Lipid Management   Cholesterol:  184 (12/29/2008)   LDL (bad  choesterol):  116 (12/29/2008)   HDL (good cholesterol):  52.70 (12/29/2008)  CBC   WBC:  5.6 (05/24/2009)   RBC:  4.36 (05/24/2009)   Hgb:  13.9 (05/24/2009)   Hct:  39.6 (05/24/2009)   Platelets:  189.0 (05/24/2009)   MCV  90.8 (05/24/2009)   MCHC  35.1 (05/24/2009)   RDW  12.9 (05/24/2009)   PMN:  57.4 (05/24/2009)   Lymphs:  27.0 (05/24/2009)   Monos:  10.4 (05/24/2009)   Eosinophils:  4.4 (05/24/2009)   Basophil:  0.8 (05/24/2009)  Complete Metabolic Panel   Glucose:  92 (05/24/2009)   Sodium:  144 (05/24/2009)   Potassium:  4.9 (05/24/2009)   Chloride:  107 (05/24/2009)   CO2:  30 (05/24/2009)   BUN:  10 (05/24/2009)   Creatinine:  0.8 (05/24/2009)   Albumin:  3.9 (05/24/2009)   Total Protein:  6.6 (05/24/2009)   Calcium:  9.2 (05/24/2009)   Total Bili:  0.7 (05/24/2009)   Alk Phos:  49 (05/24/2009)   SGPT (ALT):  10 (05/24/2009)   SGOT (AST):  19 (05/24/2009)   Current Medications (verified): 1)  Nitrofurantoin Macrocrystal 50 Mg Caps (Nitrofurantoin Macrocrystal) .Marland Kitchen.. 1 By Mouth At Bedtime 2)  Meclizine Hcl 25 Mg Tabs (Meclizine Hcl) .Marland KitchenMarland KitchenMarland Kitchen  1 By Mouth At Bedtime 3)  Gabapentin 100 Mg Caps (Gabapentin) .Marland Kitchen.. 1 By Mouth At Bedtime 4)  Caltrate 600+d Plus 600-400 Mg-Unit Tabs (Calcium Carbonate-Vit D-Min) .Marland Kitchen.. 1 By Mouth Once Daily 5)  Paroxetine Hcl 10 Mg Tabs (Paroxetine Hcl) .Marland Kitchen.. 1 By Mouth At Bedtime 6)  Aspirin 81 Mg Tabs (Aspirin) .... Take 1 By Mouth Qd 7)  Remeron 30 Mg Tabs (Mirtazapine) .... Take 2 At Bedtime 8)  Furosemide 20 Mg Tabs (Furosemide) .Marland Kitchen.. 1 Tab By Mouth Every Other Day As Needed For Feet Swelling 9)  Vitamin D 2000 Unit Tabs (Cholecalciferol) .... Take 1 By Mouth Qd 10)  Aricept 10 Mg Tabs (Donepezil Hcl) .... Take 1 By Mouth Once Daily  Allergies (verified): 1)  ! Codeine  Past History:  Past Medical History: dementia - alzheimers breast cancer - 05/31/2004 - 2 lumps removed L breast, s/p XRT and chemo  TIA - 2005 ITP  -1991 Asthma Depression Diverticulitis, hx of dyslipidemia - declined med tx  MD rooster: onc - [facility] neuro - [facility]  Review of Systems  The patient denies anorexia, fever, chest pain, syncope, headaches, and abdominal pain.    Physical Exam  General:  alert, well-developed, well-nourished, and cooperative to examination.   dtr at side Lungs:  normal respiratory effort, no intercostal retractions or use of accessory muscles; normal breath sounds bilaterally - no crackles and no wheezes.    Heart:  normal rate, regular rhythm, no murmur, and no rub. BLE without edema. Psych:  Oriented X3, memory intact for recent and remote, normally interactive, good eye contact, not anxious appearing, not depressed appearing, and not agitated.      Impression & Recommendations:  Problem # 1:  DEMENTIA (ICD-294.8)  cont ongoing care and w/u med mgmt by neuro -  symptoms mgmt with paxil and remeron reviewed, now on aricept as well- support offered Time spent with patient/dtr 25 minutes, more than 50% of this time was spent counseling patient/dtr on dementia mgmt and px and problems concerning the need for ALF care in future sooner>later  Problem # 2:  DEPRESSION (ICD-311)  Her updated medication list for this problem includes:    Paroxetine Hcl 20 Mg Tabs (Paroxetine hcl) .Marland Kitchen... 1 by mouth once daily    Remeron 30 Mg Tabs (Mirtazapine) .Marland Kitchen... Take 2 at bedtime  much improved with med tx -increase paxil dose as discussed  Problem # 3:  DYSLIPIDEMIA (ICD-272.4)  Patient has previously declined medical therapy and has mild elevations of LDL. We have discussed consideration of trial medical therapy to reduce risk of future neurologic or cardiovascular complications.  At this time patient is resistant to medical treatment we will recheck fasting lipids and rediscuss at next visit if need for other therapy is to be pursued continued attention to exercise and low-fat diet is  recommended.  Labs Reviewed: SGOT: 19 (05/24/2009)   SGPT: 10 (05/24/2009)   HDL:52.70 (12/29/2008)  LDL:116 (12/29/2008)  Chol:184 (12/29/2008)  Trig:76.0 (12/29/2008)  Problem # 4:  EDEMA (ICD-782.3)  Her updated medication list for this problem includes:    Furosemide 20 Mg Tabs (Furosemide) .Marland Kitchen... 1 tab by mouth every other day as needed for feet swelling  Discussed elevation of the legs, use of compression stockings, sodium restiction, and medication use.   Problem # 5:  Hx of ADENOCARCINOMA, BREAST (ICD-174.9)  now completed 5 years of tamoxifen 01/2010 - per onc (magrinat)  Complete Medication List: 1)  Nitrofurantoin Macrocrystal 50 Mg Caps (Nitrofurantoin macrocrystal) .Marland KitchenMarland KitchenMarland Kitchen  1 by mouth at bedtime 2)  Meclizine Hcl 25 Mg Tabs (Meclizine hcl) .Marland Kitchen.. 1 by mouth at bedtime 3)  Gabapentin 100 Mg Caps (Gabapentin) .Marland Kitchen.. 1 by mouth at bedtime 4)  Caltrate 600+d Plus 600-400 Mg-unit Tabs (Calcium carbonate-vit d-min) .Marland Kitchen.. 1 by mouth once daily 5)  Paroxetine Hcl 20 Mg Tabs (Paroxetine hcl) .Marland Kitchen.. 1 by mouth once daily 6)  Aspirin 81 Mg Tabs (Aspirin) .... Take 1 by mouth qd 7)  Remeron 30 Mg Tabs (Mirtazapine) .... Take 2 at bedtime 8)  Furosemide 20 Mg Tabs (Furosemide) .Marland Kitchen.. 1 tab by mouth every other day as needed for feet swelling 9)  Vitamin D 2000 Unit Tabs (Cholecalciferol) .... Take 1 by mouth qd 10)  Aricept 10 Mg Tabs (Donepezil hcl) .... Take 1 by mouth once daily  Patient Instructions: 1)  it was good to see you today. 2)  medications reviewed - will increase paroxetine as discussed -  3)  your prescriptions have been electronically submitted to your mail order pharmacy. Please take as directed. Contact our office if you believe you're having problems with the medication(s).  4)  will review labs from from dr. Rutherford Limerick and draw labs next time here if needed 5)  Please schedule a follow-up appointment in 6 months, sooner if problems.  Prescriptions: PAROXETINE HCL 20 MG TABS  (PAROXETINE HCL) 1 by mouth once daily  #90 x 3   Entered and Authorized by:   Newt Lukes MD   Signed by:   Newt Lukes MD on 01/31/2010   Method used:   Faxed to ...       [facility] (mail-order)             ,          Ph: [phone]       Fax: [phone]   RxID:   [id number] FUROSEMIDE 20 MG TABS (FUROSEMIDE) 1 tab by mouth every other day as needed for feet swelling  #90 x 3   Entered by:   Orlan Leavens   Authorized by:   Newt Lukes MD   Signed by:   Orlan Leavens on 01/31/2010   Method used:   Faxed to ...       [facility]* (mail-order)             ,          Ph: [phone]       Fax: [phone]   RxID:   [id number] MECLIZINE HCL 25 MG TABS (MECLIZINE HCL) 1 by mouth at bedtime  #90 x 3   Entered by:   Orlan Leavens   Authorized by:   Newt Lukes MD   Signed by:   Orlan Leavens on 01/31/2010   Method used:   Faxed to ...       [facility]* (mail-order)             ,          Ph: [phone]       Fax: [phone]   RxID:   [id number]   Appended Document: 6 mos f/u per daughter/#?cd    Clinical Lists Changes  Problems: Added new problem of GAIT DISTURBANCE (ICD-781.2) Orders: Added new Referral order of Home Health Referral [facility]) - Signed

## 2010-10-18 NOTE — Miscellaneous (Signed)
Summary: FL2 form  FL2 form   Imported By: Sherian Rein 03/06/2010 13:25:16  _____________________________________________________________________  External Attachment:    Type:   Image     Comment:   External Document

## 2010-10-18 NOTE — Miscellaneous (Signed)
Summary: Medication order/[facility]  Medication order/[facility]   Imported By: Sherian Rein 07/09/2010 13:16:51  _____________________________________________________________________  External Attachment:    Type:   Image     Comment:   External Document

## 2010-10-18 NOTE — Progress Notes (Signed)
----   Converted from flag ---- ---- 03/07/2010 3:33 PM, Edman Circle wrote: appt 7/6 @ 2:00 for echo and 3:00 for lower venous  ---- 03/07/2010 2:17 PM, Dagoberto Reef wrote: Pt need doppler and echo.   Thanks  ---- 03/06/2010 4:11 PM, Newt Lukes MD wrote: The following orders have been entered for this patient and placed on Admin Hold:  Type:     Referral       Code:   Misc. Ref Description:   Misc. Referral Order Date:   03/06/2010   Authorized By:   Newt Lukes MD Order #:   [id number] Clinical Notes:   Type of Referral:BLE doppler Reason:L>R LE swelling,r/o DVT ------------------------------

## 2010-10-18 NOTE — Letter (Signed)
Summary: [facility]  [facility]   Imported By: Lennie Odor 08/22/2010 15:37:12  _____________________________________________________________________  External Attachment:    Type:   Image     Comment:   External Document

## 2010-10-18 NOTE — Progress Notes (Signed)
Summary: lasix dose/freq clarification  Phone Note From Other Clinic   Caller: fax from [facility] Summary of Call: request to change Lasix to daily, not as needed - see fax order - done Initial call taken by: Newt Lukes MD,  April 05, 2010 8:11 AM    New/Updated Medications: LASIX 40 MG TABS (FUROSEMIDE) 1 by mouth once daily (or as directed) KLOR-CON M20 20 MEQ CR-TABS (POTASSIUM CHLORIDE CRYS CR) 1 by mouth once daily or as directed (take with every 40mg  lasix dose)

## 2010-10-18 NOTE — Letter (Signed)
Summary: [facility]  [facility]   Imported By: Sherian Rein 01/03/2010 11:21:57  _____________________________________________________________________  External Attachment:    Type:   Image     Comment:   External Document

## 2010-10-18 NOTE — Miscellaneous (Signed)
Summary: Order for Home Health Psych Nurse Eval/[facility]  Order for Home Health Psych Nurse Eval/[facility]   Imported By: Sherian Rein 09/18/2010 14:25:09  _____________________________________________________________________  External Attachment:    Type:   Image     Comment:   External Document

## 2010-10-18 NOTE — Miscellaneous (Signed)
Summary: Plan and FL2/[facility]  Plan and FL2/[facility]   Imported By: Lester Everton 04/24/2010 08:57:20  _____________________________________________________________________  External Attachment:    Type:   Image     Comment:   External Document

## 2010-10-18 NOTE — Miscellaneous (Signed)
Summary: Diet Order/[facility]  Diet Order/[facility]   Imported By: Sherian Rein 08/23/2010 11:42:17  _____________________________________________________________________  External Attachment:    Type:   Image     Comment:   External Document

## 2010-10-18 NOTE — Progress Notes (Signed)
Summary: meclizine  Phone Note Refill Request Message from:  Fax from Pharmacy on April 20, 2010 4:25 PM  Refills Requested: Medication #1:  MECLIZINE HCL 25 MG TABS 1 by mouth at bedtime # 90 [facility] [phone]   Method Requested: Fax to Mail Away Pharmacy Initial call taken by: Orlan Leavens RMA,  April 20, 2010 4:25 PM    Prescriptions: MECLIZINE HCL 25 MG TABS (MECLIZINE HCL) 1 by mouth at bedtime  #90 x 2   Entered by:   Orlan Leavens RMA   Authorized by:   Newt Lukes MD   Signed by:   Orlan Leavens RMA on 04/20/2010   Method used:   Faxed to ...       [facility] (mail-order)             , [place]         Ph: [phone]       Fax: [phone]   RxID:   [id number]

## 2010-10-18 NOTE — Progress Notes (Signed)
Summary: klor con  Phone Note Refill Request Message from:  Fax from Pharmacy on October 05, 2010 4:36 PM  Refills Requested: Medication #1:  KLOR-CON M20 20 MEQ CR-TABS 1 by mouth once daily or as directed (take with every 40mg  lasix dose)   Last Refilled: 08/23/2010 [facility]   Method Requested: Electronic Initial call taken by: Orlan Leavens RMA,  October 05, 2010 4:36 PM    Prescriptions: KLOR-CON M20 20 MEQ CR-TABS (POTASSIUM CHLORIDE CRYS CR) 1 by mouth once daily or as directed (take with every 40mg  lasix dose)  #30 x 5   Entered by:   Orlan Leavens RMA   Authorized by:   Newt Lukes MD   Signed by:   Orlan Leavens RMA on 10/05/2010   Method used:   Electronically to        [facility]* (retail)       [address]       [place], [place]  [address]       Ph: [phone]       Fax: [phone]   RxID:   [id number]

## 2010-10-18 NOTE — Miscellaneous (Signed)
Summary: Order/[facility]  Order/[facility]   Imported By: Lester [place] 05/28/2010 09:24:05  _____________________________________________________________________  External Attachment:    Type:   Image     Comment:   External Document

## 2010-10-18 NOTE — Miscellaneous (Signed)
Summary: Order for Flu Vaccination/[facility]  Order for Flu Vaccination/[facility]   Imported By: Sherian Rein 06/11/2010 11:39:15  _____________________________________________________________________  External Attachment:    Type:   Image     Comment:   External Document

## 2010-10-18 NOTE — Progress Notes (Signed)
Summary: Rx refill req  Phone Note Refill Request Message from:  Patient on August 16, 2010 3:48 PM  Refills Requested: Medication #1:  KLOR-CON M20 20 MEQ CR-TABS 1 by mouth once daily or as directed (take with every 40mg  lasix dose)   Dosage confirmed as above?Dosage Confirmed   Supply Requested: 6 months  Medication #2:  LASIX 40 MG TABS 1 by mouth once daily (or as directed)   Dosage confirmed as above?Dosage Confirmed   Supply Requested: 6 months Pt's daughter called requesting Rx to [facility], okay to Rx?   Method Requested: Electronic Initial call taken by: Margaret Pyle, CMA,  August 16, 2010 3:49 PM  Follow-up for Phone Call        yes - ok, thanks Follow-up by: Newt Lukes MD,  August 17, 2010 8:10 AM    Prescriptions: KLOR-CON M20 20 MEQ CR-TABS (POTASSIUM CHLORIDE CRYS CR) 1 by mouth once daily or as directed (take with every 40mg  lasix dose)  #90 x 1   Entered by:   Margaret Pyle, CMA   Authorized by:   Newt Lukes MD   Signed by:   Margaret Pyle, CMA on 08/17/2010   Method used:   Faxed to ...       [facility] (mail-order)             , [place]         Ph: [phone]       Fax: [phone]   RxID:   [id number] LASIX 40 MG TABS (FUROSEMIDE) 1 by mouth once daily (or as directed)  #90 x 1   Entered by:   Margaret Pyle, CMA   Authorized by:   Newt Lukes MD   Signed by:   Margaret Pyle, CMA on 08/17/2010   Method used:   Faxed to ...       [facility] (mail-order)             , [place]         Ph: [phone]       Fax: [phone]   RxID:   [id number]

## 2010-10-24 NOTE — Progress Notes (Signed)
Summary: mirtazapine  Phone Note Refill Request Message from:  Fax from Pharmacy on October 15, 2010 11:51 AM  Refills Requested: Medication #1:  REMERON 30 MG TABS take 2 at bedtime   Last Refilled: 08/16/2010 [facility] [phone]   Method Requested: Electronic Initial call taken by: Orlan Leavens RMA,  October 15, 2010 11:51 AM    Prescriptions: REMERON 30 MG TABS (MIRTAZAPINE) take 2 at bedtime  #60 x 3   Entered by:   Orlan Leavens RMA   Authorized by:   Newt Lukes MD   Signed by:   Orlan Leavens RMA on 10/15/2010   Method used:   Electronically to        [facility]* (retail)       [address]       [place], [place]  [address]       Ph: [phone]       Fax: [phone]   RxID:   [id number]

## 2010-10-29 ENCOUNTER — Encounter: Payer: Self-pay | Admitting: Internal Medicine

## 2010-11-05 ENCOUNTER — Encounter: Payer: Self-pay | Admitting: Internal Medicine

## 2010-11-06 ENCOUNTER — Encounter: Payer: Self-pay | Admitting: Internal Medicine

## 2010-11-08 ENCOUNTER — Ambulatory Visit (INDEPENDENT_AMBULATORY_CARE_PROVIDER_SITE_OTHER): Payer: Medicare Other | Admitting: Internal Medicine

## 2010-11-08 ENCOUNTER — Other Ambulatory Visit: Payer: Self-pay | Admitting: Internal Medicine

## 2010-11-08 ENCOUNTER — Telehealth: Payer: Self-pay | Admitting: Internal Medicine

## 2010-11-08 ENCOUNTER — Encounter: Payer: Self-pay | Admitting: Internal Medicine

## 2010-11-08 ENCOUNTER — Other Ambulatory Visit: Payer: Medicare Other

## 2010-11-08 DIAGNOSIS — R5381 Other malaise: Secondary | ICD-10-CM

## 2010-11-08 DIAGNOSIS — E785 Hyperlipidemia, unspecified: Secondary | ICD-10-CM

## 2010-11-08 DIAGNOSIS — R5383 Other fatigue: Secondary | ICD-10-CM | POA: Insufficient documentation

## 2010-11-08 DIAGNOSIS — L408 Other psoriasis: Secondary | ICD-10-CM | POA: Insufficient documentation

## 2010-11-08 DIAGNOSIS — R635 Abnormal weight gain: Secondary | ICD-10-CM

## 2010-11-08 DIAGNOSIS — R7309 Other abnormal glucose: Secondary | ICD-10-CM

## 2010-11-08 LAB — CBC WITH DIFFERENTIAL/PLATELET
Basophils Absolute: 0.1 10*3/uL (ref 0.0–0.1)
Basophils Relative: 1.2 % (ref 0.0–3.0)
Eosinophils Absolute: 0.2 10*3/uL (ref 0.0–0.7)
Eosinophils Relative: 2.8 % (ref 0.0–5.0)
HCT: 42.3 % (ref 36.0–46.0)
Hemoglobin: 14.2 g/dL (ref 12.0–15.0)
Lymphocytes Relative: 22.2 % (ref 12.0–46.0)
Lymphs Abs: 1.4 10*3/uL (ref 0.7–4.0)
MCHC: 33.7 g/dL (ref 30.0–36.0)
MCV: 92.1 fl (ref 78.0–100.0)
Monocytes Absolute: 0.5 10*3/uL (ref 0.1–1.0)
Monocytes Relative: 7.4 % (ref 3.0–12.0)
Neutro Abs: 4.3 10*3/uL (ref 1.4–7.7)
Neutrophils Relative %: 66.4 % (ref 43.0–77.0)
Platelets: 261 10*3/uL (ref 150.0–400.0)
RBC: 4.59 Mil/uL (ref 3.87–5.11)
RDW: 14.1 % (ref 11.5–14.6)
WBC: 6.5 10*3/uL (ref 4.5–10.5)

## 2010-11-08 LAB — BASIC METABOLIC PANEL
BUN: 8 mg/dL (ref 6–23)
CO2: 34 mEq/L — ABNORMAL HIGH (ref 19–32)
Calcium: 9.9 mg/dL (ref 8.4–10.5)
Chloride: 99 mEq/L (ref 96–112)
Creatinine, Ser: 0.7 mg/dL (ref 0.4–1.2)
GFR: 90.95 mL/min (ref >60.00)
Glucose, Bld: 107 mg/dL — ABNORMAL HIGH (ref 70–99)
Potassium: 4.6 mEq/L (ref 3.5–5.1)
Sodium: 139 mEq/L (ref 135–145)

## 2010-11-08 LAB — HEPATIC FUNCTION PANEL
Albumin: 3.9 g/dL (ref 3.5–5.2)
Total Bilirubin: 0.3 mg/dL (ref 0.3–1.2)

## 2010-11-08 LAB — LIPID PANEL
Cholesterol: 199 mg/dL (ref 0–200)
HDL: 39.8 mg/dL (ref >39.00)
VLDL: 20.4 mg/dL (ref 0.0–40.0)

## 2010-11-09 DIAGNOSIS — R630 Anorexia: Secondary | ICD-10-CM | POA: Insufficient documentation

## 2010-11-09 DIAGNOSIS — E119 Type 2 diabetes mellitus without complications: Secondary | ICD-10-CM | POA: Insufficient documentation

## 2010-11-12 ENCOUNTER — Other Ambulatory Visit: Payer: Self-pay | Admitting: Internal Medicine

## 2010-11-12 DIAGNOSIS — R630 Anorexia: Secondary | ICD-10-CM

## 2010-11-13 NOTE — Miscellaneous (Signed)
Summary: Order/[facility]  Order[facility]   Imported By: Lester Montcalm 11/07/2010 07:35:48  _____________________________________________________________________  External Attachment:    Type:   Image     Comment:   External Document

## 2010-11-13 NOTE — Progress Notes (Signed)
Summary: RX refill req  Phone Note Refill Request Message from:  Patient on November 08, 2010 3:34 PM  Refills Requested: Medication #1:  REMERON 30 MG TABS take 2 at bedtime   Dosage confirmed as above?Dosage Confirmed   Supply Requested: 3 months Per Luster Landsberg at [facility] Rx sent 01/30 was not recieved according to [facility]   Method Requested: Electronic Initial call taken by: Margaret Pyle, CMA,  November 08, 2010 3:34 PM    Prescriptions: REMERON 30 MG TABS (MIRTAZAPINE) take 2 at bedtime  #60 x 3   Entered by:   Margaret Pyle, CMA   Authorized by:   Newt Lukes MD   Signed by:   Margaret Pyle, CMA on 11/08/2010   Method used:   Electronically to        [facility]  [phone]* (retail)       [address]       [place], [place]  [address]       Ph: [phone] or [phone]       Fax: [phone]   RxID:   [id number]

## 2010-11-13 NOTE — Miscellaneous (Signed)
Summary: Order / [facility]  Order / [facility]   Imported By: Lennie Odor 11/07/2010 11:56:47  _____________________________________________________________________  External Attachment:    Type:   Image     Comment:   External Document

## 2010-11-13 NOTE — Assessment & Plan Note (Signed)
Summary: pt has alzheimer's,pt's daughter say she says she is "just no...   Vital Signs:  Patient profile:   77 year old female Weight:      165.0 pounds (75 kg) BMI:     34.61 O2 Sat:      94 % on Room air Temp:     98.4 degrees F (36.89 degrees C) oral Pulse rate:   88 / minute BP sitting:   120 / 68  (left arm) Cuff size:   large  Vitals Entered By: Orlan Leavens RMA (November 08, 2010 10:54 AM)  O2 Flow:  Room air CC: Pt states she just don'nt feel well. Not eating & drinking Pain Assessment Patient in pain? no        Primary Care Provider:  Newt Lukes MD  CC:  Pt states she just don'nt feel well. Not eating & drinking.  History of Present Illness: c/o "not feeling well" - "not like my perky slef" denies pain in head, chest, abd, back or elsewhere denoes depression denies myalgias or fever or URI or GI illness symptoms - no SOB, cough, wheeze, dysuria, D family concerned about continued weight gain or accelerated dementia reports pt much less active no recent med changes  also reviewed chronic med issues: 1) dementia - following with local neuro and [facility] - s/p PET scan showing "regular" Alz type, now on aricept for same and tol well - no adv se in ALF since Jul 2011 overall irritability is much improved on paxil  2) breast ca hx - 7 years ca free this Sept - s/p tamoxifen thru 01/2010 (7yr post chemo done) - folowing with onc for same  3) weight gain - over 40 lbs since moving here - pt attributes to antidepressant meds - reports she does not participate in any exercise - diet predominately simple carbs and sweets no bowel changes  4) ankle edema - kept under control with lasix - eval summer 2011 neg for cardiac or liver or kidney problems -no leg cramping - no SOB  5) depression - see dementia above - reports compliance with ongoing medical treatment and no changes in medication dose or frequency. denies adverse side effects related to current  therapy.  less irritable, less sad, no tearfulness - family agrees    Clinical Review Panels:  Prevention   Last Mammogram:  normal (09/17/2007)   Last Pap Smear:  normal (09/17/2007)  Immunizations   Last Tetanus Booster:  Td (12/29/2008)   Last Flu Vaccine:  Historical (06/16/2009)   Last Pneumovax:  Historical (09/17/2007)  Lipid Management   Cholesterol:  184 (12/29/2008)   LDL (bad choesterol):  116 (12/29/2008)   HDL (good cholesterol):  52.70 (12/29/2008)  CBC   WBC:  7.3 (03/06/2010)   RBC:  4.47 (03/06/2010)   Hgb:  13.9 (03/06/2010)   Hct:  40.7 (03/06/2010)   Platelets:  211.0 (03/06/2010)   MCV  91.1 (03/06/2010)   MCHC  34.0 (03/06/2010)   RDW  14.2 (03/06/2010)   PMN:  63.7 (03/06/2010)   Lymphs:  24.8 (03/06/2010)   Monos:  8.7 (03/06/2010)   Eosinophils:  2.1 (03/06/2010)   Basophil:  0.7 (03/06/2010)  Complete Metabolic Panel   Glucose:  115 (03/06/2010)   Sodium:  144 (03/06/2010)   Potassium:  4.0 (03/06/2010)   Chloride:  103 (03/06/2010)   CO2:  33 (03/06/2010)   BUN:  15 (03/06/2010)   Creatinine:  0.8 (03/06/2010)   Albumin:  3.8 (03/06/2010)   Total  Protein:  6.9 (03/06/2010)   Calcium:  9.3 (03/06/2010)   Total Bili:  0.3 (03/06/2010)   Alk Phos:  62 (03/06/2010)   SGPT (ALT):  12 (03/06/2010)   SGOT (AST):  18 (03/06/2010)   Current Medications (verified): 1)  Nitrofurantoin Macrocrystal 50 Mg Caps (Nitrofurantoin Macrocrystal) .Marland Kitchen.. 1 By Mouth At Bedtime 2)  Meclizine Hcl 25 Mg Tabs (Meclizine Hcl) .Marland Kitchen.. 1 By Mouth At Bedtime 3)  Caltrate 600+d Plus 600-400 Mg-Unit Tabs (Calcium Carbonate-Vit D-Min) .Marland Kitchen.. 1 By Mouth Once Daily 4)  Paroxetine Hcl 20 Mg Tabs (Paroxetine Hcl) .Marland Kitchen.. 1 By Mouth Once Daily 5)  Aspirin 81 Mg Tabs (Aspirin) .... Take 1 By Mouth Qd 6)  Remeron 30 Mg Tabs (Mirtazapine) .... Take 2 At Bedtime 7)  Aricept 10 Mg Tabs (Donepezil Hcl) .... Take 1 By Mouth Once Daily 8)  Lasix 40 Mg Tabs (Furosemide) .Marland Kitchen.. 1 By Mouth  Once Daily (Or As Directed) 9)  Klor-Con M20 20 Meq Cr-Tabs (Potassium Chloride Crys Cr) .Marland Kitchen.. 1 By Mouth Once Daily or As Directed (Take With Every 40mg  Lasix Dose) 10)  Gabapentin 300 Mg Caps (Gabapentin) .... Take 1 At Bedtime 11)  Qvar 80 Mcg/act Aers (Beclomethasone Dipropionate) .... 2 Pufss Two Times A Day Everyday 12)  Combivent 18-103 Mcg/act Aero (Ipratropium-Albuterol) .... 2 Puffs Three Times A Day X 7 Days, Then As Needed For Shortness of Breath Symptoms 13)  Vitamin D3 1000 Unit Tabs (Cholecalciferol) .... Take 2 By Mouth Once Daily  Allergies (verified): 1)  ! Codeine  Past History:  Past Medical History: dementia - alzheimers breast cancer -dx 05/31/2004 - 2 lumps removed L breast, s/p XRT and chemo  TIA - 2005 ITP -1991 Asthma  Depression Diverticulitis, hx of dyslipidemia - declined med tx   MD roster:  onc - [facility]rinant   neuro - [facility]  Review of Systems       The patient complains of weight gain.  The patient denies fever, chest pain, syncope, headaches, and abdominal pain.    Physical Exam  General:  alert, well-developed, well-nourished, and cooperative to examination.   dtrs at side Lungs:  normal respiratory effort with good air mvmt, no intercostal retractions or use of accessory muscles; good breath sounds bilaterally - no crackles or wheezes.    Heart:  normal rate, regular rhythm, no murmur, and no rub. BLE with trace edema, L>R.  Abdomen:  obese, soft, non-tender, normal bowel sounds, no distention; no masses and no appreciable hepatomegaly or splenomegaly.   Neurologic:  alert & oriented X3 and cranial nerves II-XII symetrically intact.  strength normal in all extremities, sensation intact to light touch, and gait normal. speech fluent without dysarthria or aphasia  follows commands with good comprehension.  Skin:  psoriasis plaque on anterior fold of left ankle, no ulceration or cellulitis Psych:  Oriented X3, memory intact for recent <  remote, normally interactive, fair eye contact, not anxious appearing, mildly depressed appearing and withdrawn - simple repetitive answers and explaination,  not agitated.      Impression & Recommendations:  Problem # 1:  FATIGUE (ICD-780.79) nonspecific hx and exam - check labs now - ?inc or change depression tx - family will f/u with neuro re: dementia 1st Orders: TLB-BMP (Basic Metabolic Panel-BMET) (80048-METABOL) TLB-CBC Platelet - w/Differential (85025-CBCD) TLB-Hepatic/Liver Function Pnl (80076-HEPATIC) TLB-TSH (Thyroid Stimulating Hormone) (84443-TSH) Nutrition Referral (Nutrition) Home Health Referral (Home Health)  Problem # 2:  WEIGHT GAIN (ICD-783.1) rec inc phys activity and nutrition asst as  available Orders: TLB-TSH (Thyroid Stimulating Hormone) (84443-TSH) Nutrition Referral (Nutrition) Home Health Referral (Home Health)  Problem # 3:  PSORIASIS, FEET (ICD-696.1)  tx left ankle with steroid as needed   Orders: Prescription Created Electronically [phone])  Complete Medication List: 1)  Nitrofurantoin Macrocrystal 50 Mg Caps (Nitrofurantoin macrocrystal) .Marland Kitchen.. 1 by mouth at bedtime 2)  Meclizine Hcl 25 Mg Tabs (Meclizine hcl) .Marland Kitchen.. 1 by mouth at bedtime 3)  Caltrate 600+d Plus 600-400 Mg-unit Tabs (Calcium carbonate-vit d-min) .Marland Kitchen.. 1 by mouth once daily 4)  Paroxetine Hcl 20 Mg Tabs (Paroxetine hcl) .Marland Kitchen.. 1 by mouth once daily 5)  Aspirin 81 Mg Tabs (Aspirin) .... Take 1 by mouth qd 6)  Remeron 30 Mg Tabs (Mirtazapine) .... Take 2 at bedtime 7)  Aricept 10 Mg Tabs (Donepezil hcl) .... Take 1 by mouth once daily 8)  Lasix 40 Mg Tabs (Furosemide) .Marland Kitchen.. 1 by mouth once daily (or as directed) 9)  Klor-con M20 20 Meq Cr-tabs (Potassium chloride crys cr) .Marland Kitchen.. 1 by mouth once daily or as directed (take with every 40mg  lasix dose) 10)  Gabapentin 300 Mg Caps (Gabapentin) .... Take 1 at bedtime 11)  Qvar 80 Mcg/act Aers (Beclomethasone dipropionate) .... 2 pufss two times a  day everyday 12)  Combivent 18-103 Mcg/act Aero (Ipratropium-albuterol) .... 2 puffs three times a day x 7 days, then as needed for shortness of breath symptoms 13)  Vitamin D3 1000 Unit Tabs (Cholecalciferol) .... Take 2 by mouth once daily 14)  Betamethasone Dipropionate 0.05 % Crea (Betamethasone dipropionate) .... Apply to affected skin two times a day  Other Orders: TLB-Lipid Panel (80061-LIPID)  Patient Instructions: 1)  it was good to see you today. 2)  steroid lotion two times a day for ankle rash until healed 3)  test(s) ordered today - your results will be posted on the phone tree for review in 48-72 hours from the time of test completion; call [phone] and enter your 9 digit MRN (listed above on this page, just below your name); if any changes need to be made or there are abnormal results, you will be contacted directly.  4)  we'll make referral for PT and nutrition consult for both you and your spouse. Our office will contact you regarding this appointment once made.  Prescriptions: BETAMETHASONE DIPROPIONATE 0.05 % CREA (BETAMETHASONE DIPROPIONATE) apply to affected skin two times a day  #1 x 1   Entered and Authorized by:   Newt Lukes MD   Signed by:   Newt Lukes MD on 11/08/2010   Method used:   Electronically to        [facility]  [phone]* (retail)       [address]       [place], [place]  [address]       Ph: [phone] or [phone]       Fax: [phone]   RxID:   [id number]    Orders Added: 1)  TLB-Lipid Panel [80061-LIPID] 2)  TLB-BMP (Basic Metabolic Panel-BMET) [80048-METABOL] 3)  TLB-CBC Platelet - w/Differential [85025-CBCD] 4)  TLB-Hepatic/Liver Function Pnl [80076-HEPATIC] 5)  TLB-TSH (Thyroid Stimulating Hormone) [84443-TSH] 6)  Nutrition Referral [Nutrition] 7)  Home Health Referral [facility]] 8)  Est. Patient Level IV [[address]756] 9)  Prescription Created Electronically [phone]

## 2010-11-13 NOTE — Miscellaneous (Signed)
Summary: [facility]  Order/ [facility]   Imported By: Lennie Odor 11/09/2010 15:01:24  _____________________________________________________________________  External Attachment:    Type:   Image     Comment:   External Document

## 2010-11-15 ENCOUNTER — Ambulatory Visit
Admission: RE | Admit: 2010-11-15 | Discharge: 2010-11-15 | Disposition: A | Discharge status: Home / Self Care | Payer: Medicare Other | Source: Ambulatory Visit | Attending: Internal Medicine | Admitting: Internal Medicine

## 2010-11-15 DIAGNOSIS — R630 Anorexia: Secondary | ICD-10-CM

## 2010-11-18 ENCOUNTER — Telehealth: Payer: Self-pay | Admitting: Internal Medicine

## 2010-11-27 ENCOUNTER — Encounter: Payer: Medicare Other | Attending: Internal Medicine | Admitting: *Deleted

## 2010-11-27 DIAGNOSIS — R635 Abnormal weight gain: Secondary | ICD-10-CM | POA: Insufficient documentation

## 2010-11-27 DIAGNOSIS — Z713 Dietary counseling and surveillance: Secondary | ICD-10-CM | POA: Insufficient documentation

## 2010-11-27 NOTE — Progress Notes (Signed)
Summary: abd [place] results 11/15/10  Phone Note Outgoing Call   Summary of Call: please call pt/family - her ultrasound  11/15/10 shows "fat" in the liver but no other problems or abnormalities - this means lab changes likely due to her weight gain, no other liver problems - no other workup needed at this time, will cont to work on diet changes and weight reduction - thanks Initial call taken by: Newt Lukes MD,  November 18, 2010 9:58 AM      Notified pt daughter (camile) with results...11/19/10@10 :08am/LMB

## 2010-12-03 ENCOUNTER — Telehealth: Payer: Self-pay | Admitting: Internal Medicine

## 2010-12-13 NOTE — Progress Notes (Signed)
Summary: lasix  Phone Note From Other Clinic   Caller: [facility] Call For: Dr. Felicity Coyer Summary of Call: MD recieved order from [facility] stating pt is out of refills on her lasix 40 once daily. Need new script. Md ok order. Faxed back to [facility], and updated EMR Initial call taken by: Orlan Leavens RMA,  December 03, 2010 11:34 AM    Prescriptions: LASIX 40 MG TABS (FUROSEMIDE) 1 by mouth once daily (or as directed)  #90 x 1   Entered by:   Orlan Leavens RMA   Authorized by:   Newt Lukes MD   Signed by:   Orlan Leavens RMA on 12/03/2010   Method used:   Historical   RxID:   [id number]

## 2011-01-08 ENCOUNTER — Encounter: Payer: Self-pay | Admitting: Internal Medicine

## 2011-01-08 ENCOUNTER — Other Ambulatory Visit: Payer: Self-pay | Admitting: *Deleted

## 2011-01-08 MED ORDER — FUROSEMIDE 40 MG PO TABS: 40.0000 mg | ORAL_TABLET | Freq: Every day | ORAL | Status: DC

## 2011-01-08 MED ORDER — GABAPENTIN 300 MG PO CAPS: 300.0000 mg | ORAL_CAPSULE | Freq: Every day | ORAL | Status: DC

## 2011-01-08 MED ORDER — DONEPEZIL HCL 10 MG PO TABS: 10.0000 mg | ORAL_TABLET | Freq: Every day | ORAL | Status: DC

## 2011-01-08 NOTE — Telephone Encounter (Signed)
Fax back refill & order to [facility]

## 2011-01-15 ENCOUNTER — Encounter: Payer: Self-pay | Admitting: Internal Medicine

## 2011-01-15 ENCOUNTER — Telehealth: Payer: Self-pay | Admitting: *Deleted

## 2011-01-15 ENCOUNTER — Telehealth: Payer: Self-pay

## 2011-01-15 MED ORDER — DONEPEZIL HCL 10 MG PO TABS: 10.0000 mg | ORAL_TABLET | Freq: Every day | ORAL | Status: DC

## 2011-01-15 MED ORDER — GABAPENTIN 300 MG PO CAPS: 300.0000 mg | ORAL_CAPSULE | Freq: Every day | ORAL | Status: DC

## 2011-01-15 MED ORDER — FUROSEMIDE 40 MG PO TABS: 40.0000 mg | ORAL_TABLET | Freq: Every day | ORAL | Status: DC

## 2011-01-15 NOTE — Telephone Encounter (Signed)
Rx's was printed incorrectly. Will send electronically #90 and send to [facility]...01/15/11@1 :27pm/LMB

## 2011-01-15 NOTE — Telephone Encounter (Signed)
Nurse from [facility] called advising that [facility] would not accept rx refiills faxed from their facility. Per nurse the 3 rx refills must be sent from our office. I advised that I would resend them to [facility]

## 2011-01-28 ENCOUNTER — Ambulatory Visit: Payer: Medicare Other | Admitting: *Deleted

## 2011-01-31 ENCOUNTER — Encounter (HOSPITAL_BASED_OUTPATIENT_CLINIC_OR_DEPARTMENT_OTHER): Payer: Medicare Other | Admitting: Oncology

## 2011-01-31 ENCOUNTER — Other Ambulatory Visit: Payer: Self-pay | Admitting: Oncology

## 2011-01-31 DIAGNOSIS — Z170 Estrogen receptor positive status [ER+]: Secondary | ICD-10-CM

## 2011-01-31 DIAGNOSIS — C50919 Malignant neoplasm of unspecified site of unspecified female breast: Secondary | ICD-10-CM

## 2011-01-31 LAB — CBC WITH DIFFERENTIAL/PLATELET
BASO%: 0.4 % (ref 0.0–2.0)
Eosinophils Absolute: 0.2 10*3/uL (ref 0.0–0.5)
LYMPH%: 20.1 % (ref 14.0–49.7)
MONO#: 0.7 10*3/uL (ref 0.1–0.9)
NEUT#: 5.1 10*3/uL (ref 1.5–6.5)
Platelets: 221 10*3/uL (ref 145–400)
RBC: 4.46 10*6/uL (ref 3.70–5.45)
RDW: 15 % — ABNORMAL HIGH (ref 11.2–14.5)
WBC: 7.5 10*3/uL (ref 3.9–10.3)
lymph#: 1.5 10*3/uL (ref 0.9–3.3)

## 2011-01-31 LAB — COMPREHENSIVE METABOLIC PANEL
ALT: 24 U/L (ref 0–35)
Albumin: 3.8 g/dL (ref 3.5–5.2)
CO2: 28 mEq/L (ref 19–32)
Calcium: 9.2 mg/dL (ref 8.4–10.5)
Chloride: 100 mEq/L (ref 96–112)
Glucose, Bld: 158 mg/dL — ABNORMAL HIGH (ref 70–99)
Potassium: 4.1 mEq/L (ref 3.5–5.3)
Sodium: 140 mEq/L (ref 135–145)
Total Protein: 6.4 g/dL (ref 6.0–8.3)

## 2011-06-11 ENCOUNTER — Telehealth: Payer: Self-pay

## 2011-06-12 NOTE — Telephone Encounter (Signed)
A user error has taken place: encounter opened in error, closed for administrative reasons.

## 2011-06-18 ENCOUNTER — Encounter: Payer: Self-pay | Admitting: Internal Medicine

## 2011-06-18 ENCOUNTER — Other Ambulatory Visit: Payer: Self-pay | Admitting: *Deleted

## 2011-06-18 ENCOUNTER — Ambulatory Visit (INDEPENDENT_AMBULATORY_CARE_PROVIDER_SITE_OTHER): Payer: Medicare Other | Admitting: Internal Medicine

## 2011-06-18 VITALS — BP 140/62 | HR 103 | Temp 97.9°F | Ht <= 58 in | Wt 181.8 lb

## 2011-06-18 DIAGNOSIS — F068 Other specified mental disorders due to known physiological condition: Secondary | ICD-10-CM

## 2011-06-18 DIAGNOSIS — F329 Major depressive disorder, single episode, unspecified: Secondary | ICD-10-CM

## 2011-06-18 DIAGNOSIS — J45909 Unspecified asthma, uncomplicated: Secondary | ICD-10-CM

## 2011-06-18 DIAGNOSIS — R635 Abnormal weight gain: Secondary | ICD-10-CM

## 2011-06-18 DIAGNOSIS — Z23 Encounter for immunization: Secondary | ICD-10-CM

## 2011-06-18 MED ORDER — DONEPEZIL HCL 10 MG PO TABS: 10.0000 mg | ORAL_TABLET | Freq: Every day | ORAL | Status: DC

## 2011-06-18 MED ORDER — POTASSIUM CHLORIDE CRYS ER 20 MEQ PO TBCR: 20.0000 meq | EXTENDED_RELEASE_TABLET | Freq: Every day | ORAL | Status: DC

## 2011-06-18 MED ORDER — NITROFURANTOIN MACROCRYSTAL 50 MG PO CAPS: 50.0000 mg | ORAL_CAPSULE | Freq: Every day | ORAL | Status: DC

## 2011-06-18 MED ORDER — GABAPENTIN 300 MG PO CAPS: 300.0000 mg | ORAL_CAPSULE | Freq: Every day | ORAL | Status: DC

## 2011-06-18 MED ORDER — PAROXETINE HCL 20 MG PO TABS: 20.0000 mg | ORAL_TABLET | ORAL | Status: DC

## 2011-06-18 MED ORDER — BECLOMETHASONE DIPROPIONATE 80 MCG/ACT IN AERS: 2.0000 | INHALATION_SPRAY | Freq: Two times a day (BID) | RESPIRATORY_TRACT | Status: DC

## 2011-06-18 MED ORDER — MECLIZINE HCL 25 MG PO TABS: 25.0000 mg | ORAL_TABLET | Freq: Every day | ORAL | Status: DC

## 2011-06-18 MED ORDER — MIRTAZAPINE 30 MG PO TABS: 60.0000 mg | ORAL_TABLET | Freq: Every day | ORAL | Status: DC

## 2011-06-18 MED ORDER — FUROSEMIDE 40 MG PO TABS: 40.0000 mg | ORAL_TABLET | Freq: Every day | ORAL | Status: DC

## 2011-06-18 MED ORDER — IPRATROPIUM-ALBUTEROL 18-103 MCG/ACT IN AERO: 2.0000 | INHALATION_SPRAY | RESPIRATORY_TRACT | Status: DC | PRN

## 2011-06-18 NOTE — Assessment & Plan Note (Signed)
symptoms well controlled The current medical regimen is effective;  continue present plan and medications.  

## 2011-06-18 NOTE — Assessment & Plan Note (Signed)
Follows with [facility] and at [facility] - symptoms stable The current medical regimen is effective;  continue present plan and medications.

## 2011-06-18 NOTE — Assessment & Plan Note (Signed)
symptoms well controlled The current medical regimen is effective;  continue present plan and medications.

## 2011-06-18 NOTE — Progress Notes (Signed)
Subjective:    Patient ID: Tamara Perez, female    DOB: Feb 07, 1933, 77 y.o.   MRN: [id number]  HPI Here for follow up - reviewed chronic med issues:   dementia - follows with local neuro and [facility] -  s/p PET 2010 scan showing "regular" Alz type, now on aricept for same and tol well - no adv se  in ALF since Jul 2011  overall irritability is much improved on paxil   breast ca hx - 2004 dx s/p tamoxifen thru 01/2010 (22yr post chemo done) -  folowing with onc for same   weight gain - over 40 lbs since move to [place] in 2010-  pt attributes to antidepressant meds -  reports she does not participate in any exercise - diet predominately simple carbs and sweets  no bowel changes   ankle edema - kept under control with lasix - eval summer 2011 neg for cardiac or liver or kidney problems -no leg cramping - no SOB   depression - see dementia above - reports compliance with ongoing medical treatment and no changes in medication dose or frequency. denies adverse side effects related to current therapy. less irritable, less sad, no tearfulness - family agrees   Past Medical History  Diagnosis Date  . ADENOCARCINOMA, BREAST   . DYSLIPIDEMIA   . DEMENTIA   . DEPRESSION   . ASTHMA   . GAIT DISTURBANCE   . Edema   . URINARY INCONTINENCE   . TRANSIENT ISCHEMIC ATTACK, HX OF   . DIVERTICULITIS, HX OF   . WEIGHT GAIN   . HYPERGLYCEMIA     Review of Systems  Constitutional: Negative for fever and fatigue.  Respiratory: Negative for cough, shortness of breath and wheezing.   Cardiovascular: Negative for chest pain and palpitations.       Objective:   Physical Exam BP 140/62  Pulse 103  Temp(Src) 97.9 F (36.6 C) (Oral)  Ht 4\' 10"  (1.473 m)  Wt 181 lb 12.8 oz (82.464 kg)  BMI 38.00 kg/m2  SpO2 94% Wt Readings from Last 3 Encounters:  06/18/11 181 lb 12.8 oz (82.464 kg)  11/08/10 165 lb (74.844 kg)  07/03/10 160 lb 6.4 oz (72.757 kg)   Constitutional: She is overweight. She  appears well-developed and well-nourished. No distress. Neck: Normal range of motion. Neck supple. No JVD present. No thyromegaly present.  Cardiovascular: Normal rate, regular rhythm and normal heart sounds.  No murmur heard. chronic BLE edema, R 2+>L 1+ Pulmonary/Chest: Effort normal and breath sounds normal. No respiratory distress. She has no wheezes.  Neurological: She is alert and oriented to person, place, and time. No cranial nerve deficit. Coordination normal.  Skin: Skin is warm and dry. No rash noted. No erythema.  Psychiatric: She has a normal mood and affect. Her behavior is normal. Judgment and thought content normal.   Lab Results  Component Value Date   WBC 6.5 11/08/2010   HGB 13.7 01/31/2011   HCT 40.9 01/31/2011   PLT 221 01/31/2011   CHOL 199 11/08/2010   TRIG 102.0 11/08/2010   HDL 39.80 11/08/2010   ALT 24 01/31/2011   AST 23 01/31/2011   NA 140 01/31/2011   K 4.1 01/31/2011   CL 100 01/31/2011   CREATININE 0.75 01/31/2011   BUN 10 01/31/2011   CO2 28 01/31/2011   TSH 2.85 11/08/2010   HGBA1C 6.5 11/08/2010       Assessment & Plan:  See problem list. Medications and labs reviewed today.

## 2011-06-18 NOTE — Patient Instructions (Signed)
It was good to see you today. Test(s) ordered today. Your results will be called to you after review (48-72hours after test completion). If any changes need to be made, you will be notified at that time. Flu shot today Medications reviewed, no changes at this time. Refill on medication(s) as discussed today. Please schedule followup in 6 months, call sooner if problems.

## 2011-06-18 NOTE — Assessment & Plan Note (Signed)
Continued gain reviewed - ?related to remeron - would like to decrease to 30mg  qhs but as this ed prescribed and managed by neuro, will defer med changes to neuro as needed Screen again for underlying med problems (DM risk, thyroid)

## 2011-06-18 NOTE — Telephone Encounter (Signed)
Pt was seen this am and was needing refills ion all meds. Called daughter camile to see exactly which pharmacy pt uses. Daughter call back need all meds sent to [facility]. Sending refills to [facility]....06/18/11@1 :54pm/LMB

## 2011-06-19 DIAGNOSIS — Z23 Encounter for immunization: Secondary | ICD-10-CM

## 2011-07-02 ENCOUNTER — Other Ambulatory Visit: Payer: Self-pay | Admitting: *Deleted

## 2011-07-02 MED ORDER — PAROXETINE HCL 20 MG PO TABS: 20.0000 mg | ORAL_TABLET | ORAL | Status: DC

## 2011-07-02 NOTE — Telephone Encounter (Signed)
R'cd fax from [facility] for refill of Paroxetine

## 2011-08-06 DIAGNOSIS — F329 Major depressive disorder, single episode, unspecified: Secondary | ICD-10-CM

## 2011-08-06 DIAGNOSIS — R262 Difficulty in walking, not elsewhere classified: Secondary | ICD-10-CM

## 2011-08-06 DIAGNOSIS — F0390 Unspecified dementia without behavioral disturbance: Secondary | ICD-10-CM

## 2011-08-06 DIAGNOSIS — J45909 Unspecified asthma, uncomplicated: Secondary | ICD-10-CM

## 2011-09-11 ENCOUNTER — Other Ambulatory Visit: Payer: Self-pay | Admitting: *Deleted

## 2011-09-11 MED ORDER — PAROXETINE HCL 20 MG PO TABS: 20.0000 mg | ORAL_TABLET | ORAL | Status: DC

## 2011-11-04 ENCOUNTER — Ambulatory Visit: Payer: Medicare Other | Admitting: Internal Medicine

## 2011-11-04 DIAGNOSIS — Z0289 Encounter for other administrative examinations: Secondary | ICD-10-CM

## 2011-11-15 ENCOUNTER — Encounter (HOSPITAL_COMMUNITY): Payer: Self-pay | Admitting: Cardiology

## 2011-11-15 ENCOUNTER — Emergency Department (HOSPITAL_COMMUNITY)
Admission: EM | Admit: 2011-11-15 | Discharge: 2011-11-15 | Disposition: A | Discharge status: Skilled Nursing Facility | Payer: Medicare Other | Attending: Emergency Medicine | Admitting: Emergency Medicine

## 2011-11-15 DIAGNOSIS — E785 Hyperlipidemia, unspecified: Secondary | ICD-10-CM | POA: Insufficient documentation

## 2011-11-15 DIAGNOSIS — R609 Edema, unspecified: Secondary | ICD-10-CM | POA: Insufficient documentation

## 2011-11-15 DIAGNOSIS — Z8673 Personal history of transient ischemic attack (TIA), and cerebral infarction without residual deficits: Secondary | ICD-10-CM | POA: Insufficient documentation

## 2011-11-15 DIAGNOSIS — M7989 Other specified soft tissue disorders: Secondary | ICD-10-CM | POA: Insufficient documentation

## 2011-11-15 DIAGNOSIS — Z7982 Long term (current) use of aspirin: Secondary | ICD-10-CM | POA: Insufficient documentation

## 2011-11-15 DIAGNOSIS — F29 Unspecified psychosis not due to a substance or known physiological condition: Secondary | ICD-10-CM | POA: Insufficient documentation

## 2011-11-15 DIAGNOSIS — Z79899 Other long term (current) drug therapy: Secondary | ICD-10-CM | POA: Insufficient documentation

## 2011-11-15 DIAGNOSIS — F068 Other specified mental disorders due to known physiological condition: Secondary | ICD-10-CM | POA: Insufficient documentation

## 2011-11-15 DIAGNOSIS — R197 Diarrhea, unspecified: Secondary | ICD-10-CM | POA: Insufficient documentation

## 2011-11-15 DIAGNOSIS — R110 Nausea: Secondary | ICD-10-CM | POA: Insufficient documentation

## 2011-11-15 DIAGNOSIS — R109 Unspecified abdominal pain: Secondary | ICD-10-CM | POA: Insufficient documentation

## 2011-11-15 DIAGNOSIS — F3289 Other specified depressive episodes: Secondary | ICD-10-CM | POA: Insufficient documentation

## 2011-11-15 DIAGNOSIS — J45909 Unspecified asthma, uncomplicated: Secondary | ICD-10-CM | POA: Insufficient documentation

## 2011-11-15 DIAGNOSIS — F329 Major depressive disorder, single episode, unspecified: Secondary | ICD-10-CM | POA: Insufficient documentation

## 2011-11-15 LAB — URINALYSIS, ROUTINE W REFLEX MICROSCOPIC
Bilirubin Urine: NEGATIVE
Hgb urine dipstick: NEGATIVE
Ketones, ur: NEGATIVE mg/dL
Protein, ur: NEGATIVE mg/dL
Urobilinogen, UA: 0.2 mg/dL (ref 0.0–1.0)

## 2011-11-15 LAB — COMPREHENSIVE METABOLIC PANEL
ALT: 34 U/L (ref 0–35)
AST: 44 U/L — ABNORMAL HIGH (ref 0–37)
CO2: 30 mEq/L (ref 19–32)
Chloride: 97 mEq/L (ref 96–112)
GFR calc non Af Amer: 81 mL/min — ABNORMAL LOW (ref >90)
Sodium: 136 mEq/L (ref 135–145)
Total Bilirubin: 0.3 mg/dL (ref 0.3–1.2)

## 2011-11-15 LAB — CBC
Hemoglobin: 10.3 g/dL — ABNORMAL LOW (ref 12.0–15.0)
MCV: 93.8 fL (ref 78.0–100.0)
Platelets: 147 10*3/uL — ABNORMAL LOW (ref 150–400)
RBC: 4.65 MIL/uL (ref 3.87–5.11)
WBC: 8.8 10*3/uL (ref 4.0–10.5)

## 2011-11-15 MED ORDER — ACETAMINOPHEN 325 MG PO TABS
325.0000 mg | ORAL_TABLET | Freq: Once | ORAL | Status: AC
  Administered 2011-11-15: 325 mg via ORAL
  Filled 2011-11-15: qty 1

## 2011-11-15 NOTE — ED Notes (Signed)
Pt to department from [facility]- pt reports that she has been having abd pain and diarrhea this morning. Staff reports that she has not been acting like herself. Vital signs within normal limits per PTAR.

## 2011-11-15 NOTE — ED Notes (Signed)
Pt finish using the bedside commode

## 2011-11-15 NOTE — ED Notes (Signed)
Pt undressed, in gown, on monitor, continuous pulse oximetry and blood pressure cuff; warm blanket given 

## 2011-11-15 NOTE — ED Notes (Signed)
Patient discharged with family member.  Written and verbal instruction given

## 2011-11-15 NOTE — ED Notes (Signed)
Pt using bedside commode at this time.

## 2011-11-15 NOTE — Discharge Instructions (Signed)
Your labs today did not show any significant infection in your blood or urine. Your electrolytes were normal. You should eat a low fiber diet to help reduce your diarrhea. You can take Imodium for your diarrhea. You can use acetaminophen (or your currently prescribed pain medication) for pain. You should be rechecked early next week. Return to the ER for further evaluation if you develop fever, vomiting, severe pain, or any other new or concerning symptoms.     Diarrhea Infections caused by germs (bacterial) or a virus commonly cause diarrhea. Your caregiver has determined that with time, rest and fluids, the diarrhea should improve. In general, eat normally while drinking more water than usual. Although water may prevent dehydration, it does not contain salt and minerals (electrolytes). Broths, weak tea without caffeine and oral rehydration solutions (ORS) replace fluids and electrolytes. Small amounts of fluids should be taken frequently. Large amounts at one time may not be tolerated. Plain water may be harmful in infants and the elderly. Oral rehydrating solutions (ORS) are available at pharmacies and grocery stores. ORS replace water and important electrolytes in proper proportions. Sports drinks are not as effective as ORS and may be harmful due to sugars worsening diarrhea.  ORS is especially recommended for use in children with diarrhea. As a general guideline for children, replace any new fluid losses from diarrhea and/or vomiting with ORS as follows:   If your child weighs 22 pounds or under (10 kg or less), give 60-120 mL ( -  cup or 2 - 4 ounces) of ORS for each episode of diarrheal stool or vomiting episode.   If your child weighs more than 22 pounds (more than 10 kgs), give 120-240 mL ( - 1 cup or 4 - 8 ounces) of ORS for each diarrheal stool or episode of vomiting.   While correcting for dehydration, children should eat normally. However, foods high in sugar should be avoided because  this may worsen diarrhea. Large amounts of carbonated soft drinks, juice, gelatin desserts and other highly sugared drinks should be avoided.   After correction of dehydration, other liquids that are appealing to the child may be added. Children should drink small amounts of fluids frequently and fluids should be increased as tolerated. Children should drink enough fluids to keep urine clear or pale yellow.   Adults should eat normally while drinking more fluids than usual. Drink small amounts of fluids frequently and increase as tolerated. Drink enough fluids to keep urine clear or pale yellow. Broths, weak decaffeinated tea, lemon lime soft drinks (allowed to go flat) and ORS replace fluids and electrolytes.   Avoid:   Carbonated drinks.   Juice.   Extremely hot or cold fluids.   Caffeine drinks.   Fatty, greasy foods.   Alcohol.   Tobacco.   Too much intake of anything at one time.   Gelatin desserts.   Probiotics are active cultures of beneficial bacteria. They may lessen the amount and number of diarrheal stools in adults. Probiotics can be found in yogurt with active cultures and in supplements.   Wash hands well to avoid spreading bacteria and virus.   Anti-diarrheal medications are not recommended for infants and children.   Only take over-the-counter or prescription medicines for pain, discomfort or fever as directed by your caregiver. Do not give aspirin to children because it may cause Reye's Syndrome.   For adults, ask your caregiver if you should continue all prescribed and over-the-counter medicines.   If your caregiver has given  you a follow-up appointment, it is very important to keep that appointment. Not keeping the appointment could result in a chronic or permanent injury, and disability. If there is any problem keeping the appointment, you must call back to this facility for assistance.  SEEK IMMEDIATE MEDICAL CARE IF:   You or your child is unable to keep  fluids down or other symptoms or problems become worse in spite of treatment.   Vomiting or diarrhea develops and becomes persistent.   There is vomiting of blood or bile (green material).   There is blood in the stool or the stools are black and tarry.   There is no urine output in 6-8 hours or there is only a small amount of very dark urine.   Abdominal pain develops, increases or localizes.   You have a fever.   Your baby is older than 3 months with a rectal temperature of 102 F (38.9 C) or higher.   Your baby is 24 months old or younger with a rectal temperature of 100.4 F (38 C) or higher.   You or your child develops excessive weakness, dizziness, fainting or extreme thirst.   You or your child develops a rash, stiff neck, severe headache or become irritable or sleepy and difficult to awaken.  MAKE SURE YOU:   Understand these instructions.   Will watch your condition.   Will get help right away if you are not doing well or get worse.  Document Released: 08/23/2002 Document Revised: 05/15/2011 Document Reviewed: 07/10/2009 [facility] Patient Information 2012 [place], [place].       Low Fiber and Residue Restricted Diet A low fiber diet restricts foods that contain carbohydrates that are not digested in the small intestine. A diet containing about 10 g of fiber is considered low fiber. The diet needs to be individualized to suit patient tolerances and preferences and to avoid unnecessary restrictions. Generally, the foods emphasized in a low fiber diet have no skins or seeds. They may have been processed to remove bran, germ, or husks. Cooking may not necessarily eliminate the fiber. Cooking may, in fact, enable a greater quantity of fiber to be consumed in a lesser volume. Legumes and nuts are also restricted. The term low residue has also been used to describe low fiber diets, although the two are not the same. Residue refers to any substance that adds to bowel (colonic)  contents, such as sloughed cells and intestinal bacteria, in addition to fiber. Residue-containing foods, prunes and prune juice, milk, and connective tissue from meats may also need to be eliminated. It is important to eliminate these foods during sudden (acute) attacks of inflammatory bowel disease, when there is a partial obstruction due to another reason, or when minimal fecal output is desired. When these problems are gone, a more normal diet may be used. PURPOSE  Prevent blockage of a partially obstructed or narrowed gastrointestinal tract.   Reduce stool weight and volume.   Slow the movement of waste.  WHEN IS THIS DIET USED?  Acute phase of Crohn's disease, ulcerative colitis, regional enteritis, or diverticulitis.   Narrowing (stenosis) of intestinal or esophageal tubes (lumina).   Transitional diet following surgery, injury (trauma), or illness.  ADEQUACY This diet is nutritionally adequate based on individual food choices according to the Recommended Dietary Allowances of the [employer]. CHOOSING FOODS Check labels, especially on foods from the starch list. Often, dietary fiber content is listed with the Nutrition Facts panel.  Breads and Starches  Allowed:  White, [place], and pita breads, plain rolls, buns, or sweet rolls, doughnuts, waffles, pancakes, bagels. Plain muffins, sweet breads, biscuits, matzoth. Flour. Soda, saltine, or graham crackers. Pretzels, rusks, melba toast, zwieback. Cooked cereals: cornmeal, farina, cream cereals. Dry cereals: refined corn, wheat, rice, and oat cereals (check label). Potatoes prepared any way without skins, refined macaroni, spaghetti, noodles, refined rice.   Avoid: Bread, rolls, or crackers made with whole-wheat, multigrains, rye, bran seeds, nuts, or coconut. Corn tortillas, table-shells. Corn chips, tortilla chips. Cereals containing whole-grains, multigrains, bran, coconut, nuts, or raisins. Cooked or dry oatmeal. Coarse  wheat cereals, granola. Cereals advertised as "high fiber." Potato skins. Whole-grain pasta, wild or brown rice. Popcorn.  Vegetables  Allowed:  Strained tomato and vegetable juices. Fresh: tender lettuce, cucumber, cabbage, spinach, bean sprouts. Cooked, canned: asparagus, bean sprouts, cut green or wax beans, cauliflower, pumpkin, beets, mushrooms, olives, spinach, yellow squash, tomato, tomato sauce (no seeds), zucchini (peeled), turnips. Canned sweet potatoes. Small amounts of celery, onion, radish, and green pepper may be used. Keep servings limited to  cup.   Avoid: Fresh, cooked, or canned: artichokes, baked beans, beet greens, broccoli, Brussels sprouts, French-style green beans, corn, kale, legumes, peas, sweet potatoes. Cooked: green or red cabbage, spinach. Avoid large servings of any vegetables.  Fruit  Allowed:  All fruit juices except prune juice. Cooked or canned: apricots applesauce, cantaloupe, cherries, grapefruit, grapes, kiwi, mandarin oranges, peaches, pears, fruit cocktail, pineapple, plums, watermelon. Fresh: banana, grapes, cantaloupe, avocado, cherries, pineapple, grapefruit, kiwi, nectarines, peaches, oranges, blueberries, plums. Keep servings limited to  cup or 1 piece.   Avoid: Fresh: apple with or without skin, apricots, mango, pears, raspberries, strawberries. Prune juice, stewed or dried prunes. Dried fruits, raisins, dates. Avoid large servings of all fresh fruits.  Meat and Meat Substitutes  Allowed:  Ground or well-cooked tender beef, ham, veal, lamb, pork, or poultry. Eggs, plain cheese. Fish, oysters, shrimp, lobster, other seafood. Liver, organ meats.   Avoid: Tough, fibrous meats with gristle. Peanut butter, smooth or chunky. Cheese with seeds, nuts, or other foods not allowed. Nuts, seeds, legumes, dried peas, beans, lentils.  Milk  Allowed:  All milk products except those not allowed. Milk and milk product consumption should be minimal when low residue is  desired.   Avoid: Yogurt that contains nuts or seeds.  Soups and Combination Foods  Allowed:  Bouillon, broth, or cream soups made from allowed foods. Any strained soup. Casseroles or mixed dishes made with allowed foods.   Avoid: Soups made from vegetables that are not allowed or that contain other foods not allowed.  Desserts and Sweets  Allowed:  Plain cakes and cookies, pie made with allowed fruit, pudding, custard, cream pie. Gelatin, fruit, ice, sherbet, frozen ice pops. Ice cream, ice milk without nuts. Plain hard candy, honey, jelly, molasses, syrup, sugar, chocolate syrup, gumdrops, marshmallows.   Avoid: Desserts, cookies, or candies that contain nuts, peanut butter, or dried fruits. Jams, preserves with seeds, marmalade.  Fats and Oils  Allowed:  Margarine, butter, cream, mayonnaise, salad oils, plain salad dressings made from allowed foods. Plain gravy, crisp bacon without rind.   Avoid: Seeds, nuts, olives. Avocados.  Beverages  Allowed:  All, except those listed to avoid.   Avoid: Fruit juices with high pulp, prune juice.  Condiments  Allowed:  Ketchup, mustard, horseradish, vinegar, cream sauce, cheese sauce, cocoa powder. Spices in moderation: allspice, basil, bay leaves, celery powder or leaves, cinnamon, cumin powder, curry powder, ginger, mace, marjoram, onion or garlic powder,  oregano, paprika, parsley flakes, ground pepper, rosemary, sage, savory, tarragon, thyme, turmeric.   Avoid: Coconut, pickles.  SAMPLE MEAL PLAN The following menu is provided as a sample. Your daily menu plans will vary. Be sure to include a minimum of the following each day in order to provide essential nutrients for the adult:  Starch/Bread/Cereal Group, 6 servings.   Fruit/Vegetable Group, 5 servings.   Meat/Meat Substitute Group, 2 servings.   Milk/Milk Substitute Group, 2 servings.  A serving is equal to  cup for fruits, vegetables, and cooked cereals or 1 piece for foods such as  a piece of bread, 1 orange, or 1 apple. For dry cereals and crackers, use serving sizes listed on the label. Combination foods may count as full or partial servings from various food groups. Fats, desserts, and sweets may be added to the meal plan after the requirements for essential nutrients are met. SAMPLE MENU Breakfast   cup orange juice.   1 boiled egg.   1 slice white toast.   Margarine.    cup cornflakes.   1 cup milk.   Beverage.  Lunch   cup chicken noodle soup.   2 to 3 oz sliced roast beef.   2 slices seedless rye bread.   Mayonnaise.    cup tomato juice.   1 small banana.   Beverage.  Dinner  3 oz baked chicken.    cup scalloped potatoes.    cup cooked beets.   White dinner roll.   Margarine.    cup canned peaches.   Beverage.  Document Released: 02/22/2002 Document Revised: 05/15/2011 Document Reviewed: 01/16/2009 [facility] Patient Information 2012 [place], [place].

## 2011-11-15 NOTE — ED Provider Notes (Signed)
Medical screening examination/treatment/procedure(s) were conducted as a shared visit with non-physician practitioner(s) and myself.  I personally evaluated the patient during the encounter  Flint Melter, MD 11/15/11 2052

## 2011-11-15 NOTE — ED Provider Notes (Signed)
History     CSN: [id number]  Arrival date & time 11/15/11  1257   First MD Initiated Contact with Patient 11/15/11 1313      Chief Complaint  Patient presents with  . Nausea  . Diarrhea    (Consider location/radiation/quality/duration/timing/severity/associated sxs/prior treatment) Patient is a 78 y.o. female presenting with diarrhea. The history is provided by the patient and a relative. History Limited By: baseline dementia.  Diarrhea The primary symptoms include abdominal pain, nausea and diarrhea. Primary symptoms do not include fever, vomiting, dysuria or rash.  The illness does not include chills or back pain.  Pt from nursing home with c/o watery, green diarrhea since this morning. Unknown number of episodes. Assoc LLQ/middle-lower abd cramps intermittently as well as nausea but no vomiting. Questionable fever at nursing home this morning, with reported temp by son-in-law to be less than 100. Nothing makes symptoms better or worse. Per son-in-law, pt recently completed abd course for skin infection but he does not recall the name (not on med list). No prior tx.  Per EMS report, nursing home felt pt has not been acting herself today. Reportedly walked naked into hallway, talking about random things. Pt tells me she just wanted to take off her clothes that were soiled with stool. Family at bedside reports she is at her baseline at time of examination.  Past Medical History  Diagnosis Date  . ADENOCARCINOMA, BREAST   . DYSLIPIDEMIA   . DEMENTIA   . DEPRESSION   . ASTHMA   . GAIT DISTURBANCE   . Edema   . URINARY INCONTINENCE   . TRANSIENT ISCHEMIC ATTACK, HX OF   . DIVERTICULITIS, HX OF   . WEIGHT GAIN   . HYPERGLYCEMIA     Past Surgical History  Procedure Date  . Breast surgery     05/31/04 2 lumps removed, s/p XRT and chemo  . Abdominal hysterectomy   . Breast biopsy 2005  . Urinary sling 2007  . Itp     1991 [facility] [place]     Family History  Problem Relation Age of Onset  . Breast cancer Mother   . Kidney disease Mother   . Stroke Mother   . Prostate cancer Father   . Heart disease Father     History  Substance Use Topics  . Smoking status: Never Smoker   . Smokeless tobacco: Not on file   Comment: lives with spouse @ [facility]  . Alcohol Use: No     Review of Systems  Constitutional: Negative for fever and chills.  HENT: Negative for congestion, sore throat, neck pain and neck stiffness.   Eyes: Negative for pain.  Respiratory: Negative for cough, chest tightness and shortness of breath.   Cardiovascular: Negative for chest pain. Leg swelling: chronic leg swelling, unchanged.  Gastrointestinal: Positive for nausea, abdominal pain and diarrhea. Negative for vomiting and blood in stool.  Genitourinary: Negative for dysuria and hematuria.  Musculoskeletal: Negative for back pain and gait problem.  Skin: Negative for rash and wound.  Neurological: Negative for dizziness, syncope and headaches.  Psychiatric/Behavioral: Positive for confusion (intermittently).    Allergies  Codeine  Home Medications   Current Outpatient Rx  Name Route Sig Dispense Refill  . IPRATROPIUM-ALBUTEROL 18-103 MCG/ACT IN AERO Inhalation Inhale 2 puffs into the lungs 3 (three) times daily.    . ASPIRIN 81 MG PO TABS Oral Take 81 mg by mouth daily.      . BECLOMETHASONE DIPROPIONATE  80 MCG/ACT IN AERS Inhalation Inhale 2 puffs into the lungs 2 (two) times daily.    Marland Kitchen CALCIUM CARBONATE-VITAMIN D 600-400 MG-UNIT PO TABS Oral Take 1 tablet by mouth daily.      Marland Kitchen VITAMIN D3 1000 UNITS PO CAPS Oral Take 2,000 Units by mouth daily.     . DONEPEZIL HCL 10 MG PO TABS Oral Take 10 mg by mouth at bedtime.    . FUROSEMIDE 40 MG PO TABS Oral Take 40 mg by mouth daily.    Marland Kitchen GABAPENTIN 300 MG PO CAPS Oral Take 300 mg by mouth at bedtime.    Marland Kitchen HYDROCODONE-ACETAMINOPHEN 5-325 MG PO TABS Oral Take 1 tablet by mouth  every 4 (four) hours as needed. For pain    . MECLIZINE HCL 25 MG PO TABS Oral Take 25 mg by mouth at bedtime.    Marland Kitchen MIRTAZAPINE 30 MG PO TABS Oral Take 60 mg by mouth at bedtime.    Marland Kitchen NITROFURANTOIN MACROCRYSTAL 50 MG PO CAPS Oral Take 50 mg by mouth at bedtime.    Marland Kitchen PAROXETINE HCL 20 MG PO TABS Oral Take 20 mg by mouth every morning.    Marland Kitchen POTASSIUM CHLORIDE CRYS ER 20 MEQ PO TBCR Oral Take 20 mEq by mouth daily.      BP 134/86  Pulse 84  Temp(Src) 99.7 F (37.6 C) (Rectal)  Resp 21  Ht 5' (1.524 m)  Wt 170 lb (77.111 kg)  BMI 33.20 kg/m2  SpO2 96%  Physical Exam  Nursing note and vitals reviewed. Constitutional: She appears well-developed and well-nourished. No distress.  HENT:  Head: Normocephalic and atraumatic.  Right Ear: External ear normal.  Left Ear: External ear normal.       Mucous membranes moist  Eyes: Conjunctivae are normal. Pupils are equal, round, and reactive to light.  Neck: Normal range of motion. Neck supple.  Cardiovascular: Normal rate, regular rhythm and normal heart sounds.   Pulmonary/Chest: Effort normal and breath sounds normal. No respiratory distress. She has no wheezes.  Abdominal: Soft. Bowel sounds are normal. Distention: obese. There is tenderness in the periumbilical area, suprapubic area and left lower quadrant. There is no rigidity, no rebound and no guarding.  Musculoskeletal: She exhibits no tenderness. Edema: 2+ pitting edema to BLE.  Neurological: No cranial nerve deficit.       Pt alert, oriented to person, place, president, year    ED Course  Procedures (including critical care time)  Labs Reviewed  CBC - Abnormal; Notable for the following:    Hemoglobin 10.3 (*)    MCH 22.2 (*)    MCHC 23.6 (*)    Platelets 147 (*)    All other components within normal limits  COMPREHENSIVE METABOLIC PANEL - Abnormal; Notable for the following:    Glucose, Bld 123 (*)    Albumin 3.4 (*)    AST 44 (*) SLIGHT HEMOLYSIS   GFR calc non Af Amer  81 (*)    All other components within normal limits  URINALYSIS, ROUTINE W REFLEX MICROSCOPIC  OCCULT BLOOD, POC DEVICE   No results found.   Dx 1: Mild Diarrheal Illness   MDM  Diarrhea for several hours with mild abd pain. Will order basic labs and continue to monitor.   4:51 PM Labs reviewed. Mild anemia based on Hgb, though this does not correlate with Hct level and is suspect. Urine without infection. Hemoccult trace positive. Pt had 1 episode of diarrhea on arrival to ED, none since that  time. Afebrile, VSS. Abd with only mild tenderness on re-examination. Will d/c back to [facility] with instructions to use imodium and acetaminophen as needed for symptoms. Pt should follow-up with PCP next week for recheck.        Shaaron Adler, [place] 11/15/11 1654

## 2011-11-15 NOTE — ED Provider Notes (Signed)
Medical screening examination/treatment/procedure(s) were conducted as a shared visit with non-physician practitioner(s) and myself.  I personally evaluated the patient during the encounter  She is billed for one day with diarrhea and mild low abdominal pain. Evaluation ED is negative. Doubt severe colitis, bowel obstruction, severe bacterial infection or UTI. Stable for discharge with symptomatic treatment.  Flint Melter, MD 11/15/11 1705

## 2011-11-20 ENCOUNTER — Encounter: Payer: Self-pay | Admitting: Internal Medicine

## 2011-11-20 ENCOUNTER — Ambulatory Visit (INDEPENDENT_AMBULATORY_CARE_PROVIDER_SITE_OTHER): Payer: Medicare Other | Admitting: Internal Medicine

## 2011-11-20 ENCOUNTER — Other Ambulatory Visit (INDEPENDENT_AMBULATORY_CARE_PROVIDER_SITE_OTHER): Payer: Medicare Other

## 2011-11-20 VITALS — BP 130/62 | HR 91 | Temp 98.1°F | Ht <= 58 in | Wt 179.0 lb

## 2011-11-20 DIAGNOSIS — R635 Abnormal weight gain: Secondary | ICD-10-CM

## 2011-11-20 DIAGNOSIS — S81009A Unspecified open wound, unspecified knee, initial encounter: Secondary | ICD-10-CM

## 2011-11-20 DIAGNOSIS — L03116 Cellulitis of left lower limb: Secondary | ICD-10-CM

## 2011-11-20 DIAGNOSIS — I872 Venous insufficiency (chronic) (peripheral): Secondary | ICD-10-CM

## 2011-11-20 DIAGNOSIS — L03119 Cellulitis of unspecified part of limb: Secondary | ICD-10-CM

## 2011-11-20 DIAGNOSIS — E162 Hypoglycemia, unspecified: Secondary | ICD-10-CM

## 2011-11-20 DIAGNOSIS — S81801A Unspecified open wound, right lower leg, initial encounter: Secondary | ICD-10-CM

## 2011-11-20 LAB — HEMOGLOBIN A1C: Hgb A1c MFr Bld: 6.9 % — ABNORMAL HIGH (ref 4.6–6.5)

## 2011-11-20 MED ORDER — CEPHALEXIN 250 MG PO CAPS: 250.0000 mg | ORAL_CAPSULE | Freq: Four times a day (QID) | ORAL | Status: DC

## 2011-11-20 NOTE — Progress Notes (Signed)
Subjective:    Patient ID: Tamara Perez, female    DOB: 21-Jul-1933, 78 y.o.   MRN: [id number]  HPI  Here for R leg problem - small "sore" on anterior shin Denies recollection of trauma - Chronic edema and erythema without change No fever or pain  Also reviewed chronic med issues:   dementia - follows with local neuro and [facility] -  s/p PET 2010 scan showing "regular" Alz type, now on aricept for same and tol well - no adv se  in ALF since Jul 2011 -overall irritability is much improved on paxil   breast ca hx - 2004 dx s/p tamoxifen thru 01/2010 (13yr post chemo done) -  folowing with onc for same   weight gain - over 40 lbs since move to [place] in 2010-  pt attributes to antidepressant meds -  reports she does not participate in any exercise - diet predominately simple carbs and sweets  no bowel changes   ankle edema - kept under control with lasix - eval summer 2011 neg for cardiac or liver or kidney problems -no leg cramping - no shortness of breath or dyspnea on exertion   depression - see dementia above - reports compliance with ongoing medical treatment and no changes in medication dose or frequency. denies adverse side effects related to current therapy. less irritable, less sad, no tearfulness - family agrees   Past Medical History  Diagnosis Date  . ADENOCARCINOMA, BREAST   . DYSLIPIDEMIA   . DEMENTIA   . DEPRESSION   . ASTHMA   . GAIT DISTURBANCE   . Edema   . URINARY INCONTINENCE   . TRANSIENT ISCHEMIC ATTACK, HX OF   . DIVERTICULITIS, HX OF   . HYPERGLYCEMIA     Review of Systems  Constitutional: Negative for fever and fatigue.  Respiratory: Negative for cough, shortness of breath and wheezing.   Cardiovascular: Negative for chest pain and palpitations.       Objective:   Physical Exam  BP 130/62  Pulse 91  Temp(Src) 98.1 F (36.7 C) (Oral)  Ht 4\' 10"  (1.473 m)  Wt 179 lb (81.194 kg)  BMI 37.41 kg/m2  SpO2 94% Wt Readings from Last 3 Encounters:    11/20/11 179 lb (81.194 kg)  11/15/11 170 lb (77.111 kg)  06/18/11 181 lb 12.8 oz (82.464 kg)   Constitutional: She is overweight. She appears well-developed and well-nourished. No distress. Neck: Normal range of motion. Neck supple. No JVD present. No thyromegaly present.  Cardiovascular: Normal rate, regular rhythm and normal heart sounds.  No murmur heard. chronic BLE edema, R 2+>L 1+ Pulmonary/Chest: Effort normal and breath sounds normal. No respiratory distress. She has no wheezes.  Neurological: She is alert and oriented to person, place, and time. No cranial nerve deficit. Coordination normal.  Skin: mild anterior R shin with superficial wound - debrided "dead" skin and no underlying ulceration or wound. Mild erythema R>L distal leg with venous insuff, ?early cellulitis. Remaining skin is warm and dry. No rash noted.  Psychiatric: She has a normal mood and affect. Her behavior is normal. Judgment and thought content normal.   Lab Results  Component Value Date   WBC 8.8 11/15/2011   HGB 10.3* 11/15/2011   HCT 43.6 11/15/2011   PLT 147* 11/15/2011   CHOL 199 11/08/2010   TRIG 102.0 11/08/2010   HDL 39.80 11/08/2010   ALT 34 11/15/2011   AST 44* 11/15/2011   NA 136 11/15/2011   K 5.1 11/15/2011  CL 97 11/15/2011   CREATININE 0.71 11/15/2011   BUN 6 11/15/2011   CO2 30 11/15/2011   TSH 2.85 11/08/2010   HGBA1C 6.5 11/08/2010       Assessment & Plan:  R shin wound - suspect residual from blunt trauma - debridement in office today - tolerated well - empiric keflex for ?early cellulitis - continue support stocking for venous insuff and lasix as needed See problem list. Medications and labs reviewed today.

## 2011-11-20 NOTE — Assessment & Plan Note (Signed)
Continued gain reviewed - ?related to remeron -  i would like to decrease to 30mg  qhs but as this med is prescribed and managed by neuro, i will defer med changes to neuro as needed Screen again for underlying med problems (DM risk, thyroid)

## 2011-11-20 NOTE — Patient Instructions (Signed)
It was good to see you today. We have removed the dead skin tissue from your right leg wound in the office today - May keep covered as needed Keflex antibiotics 4 times a day x1 week - Your prescription(s) have been submitted to your pharmacy. Please take as directed and contact our office if you believe you are having problem(s) with the medication(s).  I recommend use of support stockings daily from 8 AM until bedtime to help control chronic swelling. Okay to remove at bedtime and sleep without stockings on Test(s) ordered today. Your results will be called to you after review (48-72hours after test completion). If any changes need to be made, you will be notified at that time. Marland Kitchenother Medications reviewed, no additional changes at this time. Please schedule followup in 6 months - can reschedule April appointment if needed, call sooner if problems.

## 2011-11-29 ENCOUNTER — Ambulatory Visit: Payer: Medicare Other | Admitting: Internal Medicine

## 2011-11-29 ENCOUNTER — Ambulatory Visit (INDEPENDENT_AMBULATORY_CARE_PROVIDER_SITE_OTHER)
Admission: RE | Admit: 2011-11-29 | Discharge: 2011-11-29 | Disposition: A | Discharge status: Home / Self Care | Payer: Medicare Other | Source: Ambulatory Visit | Attending: Internal Medicine | Admitting: Internal Medicine

## 2011-11-29 ENCOUNTER — Ambulatory Visit (INDEPENDENT_AMBULATORY_CARE_PROVIDER_SITE_OTHER): Payer: Medicare Other | Admitting: Internal Medicine

## 2011-11-29 ENCOUNTER — Encounter: Payer: Self-pay | Admitting: Internal Medicine

## 2011-11-29 VITALS — BP 120/72 | HR 91 | Temp 98.5°F

## 2011-11-29 DIAGNOSIS — J180 Bronchopneumonia, unspecified organism: Secondary | ICD-10-CM

## 2011-11-29 DIAGNOSIS — R05 Cough: Secondary | ICD-10-CM

## 2011-11-29 DIAGNOSIS — J45901 Unspecified asthma with (acute) exacerbation: Secondary | ICD-10-CM

## 2011-11-29 DIAGNOSIS — R059 Cough, unspecified: Secondary | ICD-10-CM

## 2011-11-29 MED ORDER — DOXYCYCLINE HYCLATE 100 MG PO TABS: 100.0000 mg | ORAL_TABLET | Freq: Two times a day (BID) | ORAL | Status: AC

## 2011-11-29 MED ORDER — BENZONATATE 100 MG PO CAPS: 100.0000 mg | ORAL_CAPSULE | Freq: Two times a day (BID) | ORAL | Status: AC | PRN

## 2011-11-29 MED ORDER — METHYLPREDNISOLONE ACETATE 80 MG/ML IJ SUSP
120.0000 mg | Freq: Once | INTRAMUSCULAR | Status: AC
  Administered 2011-11-29: 120 mg via INTRAMUSCULAR

## 2011-11-29 NOTE — Progress Notes (Signed)
  Subjective:    HPI  complains of cough Onset >1 week ago, progressivee symptoms  associated with sore throat, mild headache and low grade fever Also myalgias and mild-mod chest congestion No relief with OTC meds Precipitated by sick contacts  Past Medical History  Diagnosis Date  . ADENOCARCINOMA, BREAST   . DYSLIPIDEMIA   . DEMENTIA   . DEPRESSION   . ASTHMA   . GAIT DISTURBANCE   . Edema   . URINARY INCONTINENCE   . TRANSIENT ISCHEMIC ATTACK, HX OF   . DIVERTICULITIS, HX OF   . HYPERGLYCEMIA     Review of Systems Constitutional: No night sweats, no unexpected weight change Pulmonary: No pleurisy or hemoptysis Cardiovascular: No chest pain or palpitations     Objective:   Physical Exam BP 120/72  Pulse 91  Temp(Src) 98.5 F (36.9 C) (Oral)  SpO2 90% GEN: mildly ill appearing and audible chest congestion HENT: NCAT, no sinus tenderness, nares with clear discharge, oropharynx mild erythema, no exudate Eyes: Vision grossly intact, no conjunctivitis Lungs: few scattered rhonchi L>R and exp wheeze, no increased work of breathing Cardiovascular: Regular rate and rhythm, no bilateral edema      Assessment & Plan:   Cough, rule out pneumonia  Asthma exac due to above  IM medrol today Empiric antibiotics prescribed due to symptom duration greater than 7 days Prescription cough suppression - new prescriptions done Symptomatic care with Tylenol, hydration and rest -  Cepacol or chloraseptic advised as needed

## 2011-11-29 NOTE — Patient Instructions (Addendum)
It was good to see you today. Steroid shot given for your wheezing today Chest xray ordered today. Your results will be called to you after review. If any changes need to be made, you will be notified at that time. Doxycycline antibiotics twice daily times one week, Tessalon as needed twice daily for cough - Your prescription(s) have been submitted to your pharmacy. Please take as directed and contact our office if you believe you are having problem(s) with the medication(s). Use Tylenol as needed for fever, aches and Cepacol or Chloraseptic as needed for sore throat if your symptoms continue to worsen (cough, pain, fever, etc), or if you are unable take anything by mouth (pills, fluids, etc), you should go to the emergency room for further evaluation and treatment.

## 2011-12-09 ENCOUNTER — Telehealth: Payer: Self-pay | Admitting: *Deleted

## 2011-12-09 NOTE — Telephone Encounter (Signed)
Noted - can cancel HH if not needed

## 2011-12-09 NOTE — Telephone Encounter (Signed)
Pt was referred for home health services. Wanted to inform md pt is getting around fine 7 doesn't need PT... 12/09/11@2 :24pm/LMB

## 2011-12-24 ENCOUNTER — Ambulatory Visit: Payer: Medicare Other | Admitting: Internal Medicine

## 2012-01-28 ENCOUNTER — Telehealth: Payer: Self-pay

## 2012-01-28 DIAGNOSIS — M7989 Other specified soft tissue disorders: Secondary | ICD-10-CM

## 2012-01-28 NOTE — Telephone Encounter (Signed)
HHRN called stating LLE is extremely red, inflamed, hot and tender to the touch, RN thinks it may be cellulitis. HHRN states that she can admit pt to Home Health Nursing to monitor her leg. HHRN is requesting MD advise on either an ABX (faxed to [phone]), or should pt make OV. [facility] aware MD out of office until 01/29/2012

## 2012-01-29 MED ORDER — CEPHALEXIN 250 MG PO CAPS: 250.0000 mg | ORAL_CAPSULE | Freq: Four times a day (QID) | ORAL | Status: AC

## 2012-01-29 NOTE — Telephone Encounter (Signed)
Ok to admit to [facility] nursing - Keflex rx - may fax as requested OV if worse or unimproved

## 2012-01-29 NOTE — Telephone Encounter (Signed)
Tamara Perez advised and states that [facility] referral will be forwarded to [facility] because [facility] is not accepted. Tammy at [facility] advised of same and is requesting written order for [facility] to eval and treat leg. Fax - [phone].

## 2012-01-29 NOTE — Telephone Encounter (Signed)
Faxed order back to Tammy @ [phone]... 01/30/12@2 :22pm/LMB

## 2012-01-30 ENCOUNTER — Telehealth: Payer: Self-pay | Admitting: *Deleted

## 2012-01-30 NOTE — Telephone Encounter (Signed)
Notified karen with md response... 01/30/12@1 :13pm/LMB

## 2012-01-30 NOTE — Telephone Encounter (Signed)
No specific orders as i have not seen pt for this problem

## 2012-01-30 NOTE — Telephone Encounter (Signed)
Received order for home health concerning leg. ? If there is any specific orders. Nurse states both legs are really dry (R) leg is worst.  Have some edema 1 (R) leg.... 01/30/12@11 :59am/LMB

## 2012-02-13 ENCOUNTER — Telehealth: Payer: Self-pay | Admitting: Internal Medicine

## 2012-02-13 NOTE — Telephone Encounter (Signed)
Patient need to see md to be evaluated. Pls see previous phone encounter with md response... 02/13/12@2 :06pm/LMB

## 2012-02-13 NOTE — Telephone Encounter (Signed)
PT IS STILL HAVING REDDNESS AN MAY STILL HAVE SOME CELLUTIS AFTER FINISHING HER LAST ANTIBIOTIC SEVERAL DAYS AGO.  WHAT TO DO NEXT?

## 2012-02-13 NOTE — Telephone Encounter (Signed)
LMOM for her daughter to call to set up an appt.

## 2012-02-14 ENCOUNTER — Encounter: Payer: Self-pay | Admitting: Internal Medicine

## 2012-02-14 ENCOUNTER — Ambulatory Visit (INDEPENDENT_AMBULATORY_CARE_PROVIDER_SITE_OTHER): Payer: Medicare Other | Admitting: Internal Medicine

## 2012-02-14 VITALS — BP 122/70 | HR 105 | Temp 98.3°F | Ht 62.0 in | Wt 185.0 lb

## 2012-02-14 DIAGNOSIS — L259 Unspecified contact dermatitis, unspecified cause: Secondary | ICD-10-CM

## 2012-02-14 DIAGNOSIS — F068 Other specified mental disorders due to known physiological condition: Secondary | ICD-10-CM

## 2012-02-14 DIAGNOSIS — L309 Dermatitis, unspecified: Secondary | ICD-10-CM

## 2012-02-14 DIAGNOSIS — R609 Edema, unspecified: Secondary | ICD-10-CM

## 2012-02-14 MED ORDER — TRIAMCINOLONE ACETONIDE 0.1 % EX CREA: TOPICAL_CREAM | Freq: Two times a day (BID) | CUTANEOUS | Status: DC

## 2012-02-14 NOTE — Patient Instructions (Signed)
It was good to see you today. No evidence for infection so will hold antibiotics Use triamcinolone steroid cream as discussed to affected skin - Your prescription(s) have been submitted to your pharmacy. Please take as directed and contact our office if you believe you are having problem(s) with the medication(s). we'll make referral to dermatology for opinion on skin problem. Our office will contact you regarding appointment(s) once made.

## 2012-02-14 NOTE — Assessment & Plan Note (Signed)
Follows with neuro local and at [facility] - symptoms stable The current medical regimen is effective;  continue present plan and medications.  

## 2012-02-14 NOTE — Telephone Encounter (Signed)
PT IS COMING TODAY

## 2012-02-14 NOTE — Progress Notes (Signed)
  Subjective:    Patient ID: Tamara Perez, female    DOB: 05/31/1933, 78 y.o.   MRN: [id number]  HPI  here for redness of RLE Onset >3 months, gradually progressive unimproved despite Keflex course Denies new swelling, pain or fever associated with dry and flaking skin  Past Medical History  Diagnosis Date  . ADENOCARCINOMA, BREAST   . DYSLIPIDEMIA   . DEMENTIA   . DEPRESSION   . ASTHMA   . GAIT DISTURBANCE   . Edema   . URINARY INCONTINENCE   . TRANSIENT ISCHEMIC ATTACK, HX OF   . DIVERTICULITIS, HX OF   . HYPERGLYCEMIA     Review of Systems  Constitutional: Positive for fatigue. Negative for fever and chills.  Respiratory: Negative for cough and shortness of breath.   Cardiovascular: Positive for leg swelling (chronic). Negative for chest pain.       Objective:   Physical Exam BP 122/70  Pulse 105  Temp(Src) 98.3 F (36.8 C) (Oral)  Ht 5\' 2"  (1.575 m)  Wt 185 lb (83.915 kg)  BMI 33.84 kg/m2  SpO2 92% Wt Readings from Last 3 Encounters:  02/14/12 185 lb (83.915 kg)  11/20/11 179 lb (81.194 kg)  11/15/11 170 lb (77.111 kg)   Constitutional: She is overweight - appears well-developed and well-nourished. No distress.  son in law Neck: Normal range of motion. Neck supple. No JVD present. No thyromegaly present.  Cardiovascular: Normal rate, regular rhythm and normal heart sounds.  No murmur heard. chronic 1+ BLE edema. Pulmonary/Chest: Effort normal and breath sounds normal. No respiratory distress. She has no wheezes.  Skin: patchy nodular bright pink skin circumferential around distal RLE at calf - dry and scaling skin - other skin is warm and dry. No rash noted. No erythema.  Psychiatric: She has a normal mood and affect. Her behavior is normal. Judgment and thought content normal.       Assessment & Plan:  RLE dermatitis - no evidence of cellulitis and unimproved with Keflex course Change to steroid cream and refer to derm for 2nd opnion

## 2012-02-14 NOTE — Assessment & Plan Note (Signed)
Chronic - related to venous insuff and ongoing weight gain Noncompliant with compression hose and diet continue present medications.

## 2012-02-26 DIAGNOSIS — E785 Hyperlipidemia, unspecified: Secondary | ICD-10-CM

## 2012-02-26 DIAGNOSIS — R609 Edema, unspecified: Secondary | ICD-10-CM

## 2012-02-26 DIAGNOSIS — F0390 Unspecified dementia without behavioral disturbance: Secondary | ICD-10-CM

## 2012-03-09 DIAGNOSIS — F0390 Unspecified dementia without behavioral disturbance: Secondary | ICD-10-CM

## 2012-03-09 DIAGNOSIS — E785 Hyperlipidemia, unspecified: Secondary | ICD-10-CM

## 2012-03-09 DIAGNOSIS — R609 Edema, unspecified: Secondary | ICD-10-CM

## 2012-03-24 DIAGNOSIS — R5383 Other fatigue: Secondary | ICD-10-CM

## 2012-03-24 DIAGNOSIS — R5381 Other malaise: Secondary | ICD-10-CM

## 2012-03-24 DIAGNOSIS — R269 Unspecified abnormalities of gait and mobility: Secondary | ICD-10-CM

## 2012-03-24 DIAGNOSIS — J069 Acute upper respiratory infection, unspecified: Secondary | ICD-10-CM

## 2012-03-24 DIAGNOSIS — J45909 Unspecified asthma, uncomplicated: Secondary | ICD-10-CM

## 2012-04-14 ENCOUNTER — Other Ambulatory Visit: Payer: Self-pay | Admitting: *Deleted

## 2012-04-14 MED ORDER — TRIAMCINOLONE ACETONIDE 0.1 % EX CREA: TOPICAL_CREAM | Freq: Two times a day (BID) | CUTANEOUS | Status: DC

## 2012-05-12 ENCOUNTER — Telehealth: Payer: Self-pay | Admitting: *Deleted

## 2012-05-12 DIAGNOSIS — R269 Unspecified abnormalities of gait and mobility: Secondary | ICD-10-CM

## 2012-05-12 NOTE — Telephone Encounter (Signed)
Will ask HHPT/OT to eval for recommendations on most appropriate type of DME -

## 2012-05-12 NOTE — Telephone Encounter (Signed)
Notified camile with md response...Raechel Chute

## 2012-05-12 NOTE — Telephone Encounter (Signed)
Daughter is requesting a rx for wheelchair with seat. She states mom has a hard time walking.Marland Kitchen/LMB

## 2012-05-20 ENCOUNTER — Ambulatory Visit (INDEPENDENT_AMBULATORY_CARE_PROVIDER_SITE_OTHER): Payer: Medicare Other | Admitting: Internal Medicine

## 2012-05-20 ENCOUNTER — Ambulatory Visit: Payer: Medicare Other | Admitting: Internal Medicine

## 2012-05-20 ENCOUNTER — Encounter: Payer: Self-pay | Admitting: Internal Medicine

## 2012-05-20 VITALS — BP 134/68 | HR 95 | Temp 98.0°F | Ht 62.0 in | Wt 181.4 lb

## 2012-05-20 DIAGNOSIS — T148XXA Other injury of unspecified body region, initial encounter: Secondary | ICD-10-CM

## 2012-05-20 DIAGNOSIS — I8310 Varicose veins of unspecified lower extremity with inflammation: Secondary | ICD-10-CM

## 2012-05-20 DIAGNOSIS — F068 Other specified mental disorders due to known physiological condition: Secondary | ICD-10-CM

## 2012-05-20 DIAGNOSIS — I872 Venous insufficiency (chronic) (peripheral): Secondary | ICD-10-CM | POA: Insufficient documentation

## 2012-05-20 NOTE — Patient Instructions (Signed)
It was good to see you today. We removed the blister and dead skin in office today - keep covered with gauze until "new" skin forms - change dressing 2x/day and as needed No evidence for infection so will hold antibiotics continue steroid cream as discussed to affected red skin on right leg -

## 2012-05-20 NOTE — Assessment & Plan Note (Signed)
Follows with neuro local and at [facility] - symptoms stable The current medical regimen is effective;  continue present plan and medications.  

## 2012-05-20 NOTE — Progress Notes (Signed)
  Subjective:    Patient ID: Tamara Perez, female    DOB: [birth year], 78 y.o.   MRN: [id number]  HPI   here for "blister" on RLE Onset 3-4 days ago Denies trauma (but pt crosses her legs - ?pressure friction) Onset RLE "problems" since early 2013 -  has seen derm for same (03/2012 - dx chronic venous stasis dermatitis) Denies pain or fever no change in chronic edema/swelling  Past Medical History  Diagnosis Date  . ADENOCARCINOMA, BREAST   . DYSLIPIDEMIA   . DEMENTIA   . DEPRESSION   . ASTHMA   . GAIT DISTURBANCE   . Edema   . URINARY INCONTINENCE   . TRANSIENT ISCHEMIC ATTACK, HX OF   . DIVERTICULITIS, HX OF   . HYPERGLYCEMIA   . Venous stasis dermatitis     R>L LE, chronic    Review of Systems  Constitutional: Positive for fatigue. Negative for fever and chills.  Respiratory: Negative for cough and shortness of breath.   Cardiovascular: Positive for leg swelling (chronic). Negative for chest pain.       Objective:   Physical Exam  BP 134/68  Pulse 95  Temp 98 F (36.7 C) (Oral)  Ht 5\' 2"  (1.575 m)  Wt 181 lb 6.4 oz (82.283 kg)  BMI 33.18 kg/m2  SpO2 95% Wt Readings from Last 3 Encounters:  05/20/12 181 lb 6.4 oz (82.283 kg)  02/14/12 185 lb (83.915 kg)  11/20/11 179 lb (81.194 kg)   Constitutional: She is overweight - appears well-developed and well-nourished. No distress.  driver at side Neck: Normal range of motion. Neck supple. No JVD present. No thyromegaly present.  Cardiovascular: Normal rate, regular rhythm and normal heart sounds.  No murmur heard. chronic 1+ BLE edema, R>L (unchanged). Pulmonary/Chest: Effort normal and breath sounds normal. No respiratory distress. She has no wheezes.  Skin: 5cm diameter taut clear blister on RLE posterior medial edge - no erythema or purulence - chronic patchy nodular bright pink skin circumferential around distal RLE at calf - dry and scaling skin - other skin is warm and dry. No rash noted. No erythema.    Psychiatric: She has a normal mood and affect. Her behavior is normal. Judgment and thought content repetitive (baseline).   Lab Results  Component Value Date   WBC 8.8 11/15/2011   HGB 10.3* 11/15/2011   HCT 43.6 11/15/2011   PLT 147* 11/15/2011   GLUCOSE 123* 11/15/2011   CHOL 199 11/08/2010   TRIG 102.0 11/08/2010   HDL 39.80 11/08/2010   LDLCALC 139* 11/08/2010   ALT 34 11/15/2011   AST 44* 11/15/2011   NA 136 11/15/2011   K 5.1 11/15/2011   CL 97 11/15/2011   CREATININE 0.71 11/15/2011   BUN 6 11/15/2011   CO2 30 11/15/2011   TSH 3.69 11/20/2011   HGBA1C 6.9* 11/20/2011      Assessment & Plan:  RLE venous insuff blister - no evidence for infection - due to size and high likely hood of traumatic rupture, blister removed under sterile condition via drainage with 25g syringe and removal of dead skin. Fluid is 100% clear, base is soft pink without lesion - The patient is reassured that these symptoms do not appear to represent a serious or threatening condition. Education provided  RLE stasis dermatitis, chronic - no evidence of cellulitis - s.p derm eval 03/2012 for same Continue high dose bid steroid cream and compression hose/elevation and weight loss advised

## 2012-05-26 ENCOUNTER — Telehealth: Payer: Self-pay | Admitting: Internal Medicine

## 2012-05-26 DIAGNOSIS — S81809A Unspecified open wound, unspecified lower leg, initial encounter: Secondary | ICD-10-CM

## 2012-05-26 NOTE — Telephone Encounter (Signed)
Facility fax orders this morning. Place on md desk for advisement...Tamara Perez

## 2012-05-26 NOTE — Telephone Encounter (Signed)
Faxed order back to [facility]...Tamara Perez

## 2012-05-26 NOTE — Telephone Encounter (Signed)
Charlotte with [facility].  Callback [phone].  Patient Tamara Perez has a blister on right shin that was drained by Dr Felicity Coyer last week.  Blister is draining, red and swollen.  Claris Gower is calling for orders.

## 2012-05-26 NOTE — Telephone Encounter (Signed)
Home health RN for wound care

## 2012-06-03 ENCOUNTER — Telehealth: Payer: Self-pay | Admitting: *Deleted

## 2012-06-03 ENCOUNTER — Telehealth: Payer: Self-pay | Admitting: Internal Medicine

## 2012-06-03 DIAGNOSIS — F0390 Unspecified dementia without behavioral disturbance: Secondary | ICD-10-CM

## 2012-06-03 DIAGNOSIS — IMO0001 Reserved for inherently not codable concepts without codable children: Secondary | ICD-10-CM

## 2012-06-03 DIAGNOSIS — I8310 Varicose veins of unspecified lower extremity with inflammation: Secondary | ICD-10-CM

## 2012-06-03 DIAGNOSIS — R269 Unspecified abnormalities of gait and mobility: Secondary | ICD-10-CM

## 2012-06-03 NOTE — Telephone Encounter (Signed)
ok 

## 2012-06-03 NOTE — Telephone Encounter (Signed)
Tamara Perez from [facility] requesting 4 wheel walker for pt--Tamara Perez/  [phone] ext [phone]

## 2012-06-03 NOTE — Telephone Encounter (Signed)
Verbal okay. Thanks

## 2012-06-03 NOTE — Telephone Encounter (Signed)
Verbal order given  

## 2012-06-03 NOTE — Telephone Encounter (Signed)
[facility] needs orders to continue would care twice weekly to cleanse with saline, use calcium alginate,  Dry dressing and wrap with Curlex-verbal order okay

## 2012-06-04 NOTE — Telephone Encounter (Signed)
Kay notified

## 2012-06-08 ENCOUNTER — Telehealth: Payer: Self-pay | Admitting: General Practice

## 2012-06-08 NOTE — Telephone Encounter (Signed)
done

## 2012-06-08 NOTE — Telephone Encounter (Signed)
Received fax from [facility] regarding Pt Dx summary. Needs your signature and to be returned. Placed in basket on 9/23.

## 2012-06-09 ENCOUNTER — Telehealth: Payer: Self-pay | Admitting: General Practice

## 2012-06-09 NOTE — Telephone Encounter (Signed)
done

## 2012-06-09 NOTE — Telephone Encounter (Signed)
Faxed

## 2012-06-09 NOTE — Telephone Encounter (Signed)
Received orders for Pt OT. Order # [id number]. Need signature and to return. Placed in basket on 9/24.

## 2012-06-09 NOTE — Telephone Encounter (Signed)
Paperwork faxed on 9/23.

## 2012-06-11 ENCOUNTER — Telehealth: Payer: Self-pay | Admitting: General Practice

## 2012-06-11 NOTE — Telephone Encounter (Signed)
Received Orders on Pt. Need sig. Placed in basket on 9/26.

## 2012-06-11 NOTE — Telephone Encounter (Signed)
done

## 2012-06-22 ENCOUNTER — Encounter: Payer: Self-pay | Admitting: Internal Medicine

## 2012-06-22 ENCOUNTER — Other Ambulatory Visit (INDEPENDENT_AMBULATORY_CARE_PROVIDER_SITE_OTHER): Payer: Medicare Other

## 2012-06-22 ENCOUNTER — Ambulatory Visit (INDEPENDENT_AMBULATORY_CARE_PROVIDER_SITE_OTHER): Payer: Medicare Other | Admitting: Internal Medicine

## 2012-06-22 VITALS — BP 138/72 | HR 95 | Temp 98.8°F | Ht 62.0 in | Wt 182.0 lb

## 2012-06-22 DIAGNOSIS — I8310 Varicose veins of unspecified lower extremity with inflammation: Secondary | ICD-10-CM

## 2012-06-22 DIAGNOSIS — R7309 Other abnormal glucose: Secondary | ICD-10-CM

## 2012-06-22 DIAGNOSIS — F068 Other specified mental disorders due to known physiological condition: Secondary | ICD-10-CM

## 2012-06-22 DIAGNOSIS — I83009 Varicose veins of unspecified lower extremity with ulcer of unspecified site: Secondary | ICD-10-CM

## 2012-06-22 DIAGNOSIS — I872 Venous insufficiency (chronic) (peripheral): Secondary | ICD-10-CM

## 2012-06-22 DIAGNOSIS — R609 Edema, unspecified: Secondary | ICD-10-CM

## 2012-06-22 DIAGNOSIS — L97909 Non-pressure chronic ulcer of unspecified part of unspecified lower leg with unspecified severity: Secondary | ICD-10-CM

## 2012-06-22 LAB — CBC WITH DIFFERENTIAL/PLATELET
Basophils Absolute: 0.1 10*3/uL (ref 0.0–0.1)
Eosinophils Absolute: 0.2 10*3/uL (ref 0.0–0.7)
Eosinophils Relative: 2.2 % (ref 0.0–5.0)
MCHC: 32.5 g/dL (ref 30.0–36.0)
MCV: 93.4 fl (ref 78.0–100.0)
Monocytes Absolute: 0.6 10*3/uL (ref 0.1–1.0)
Neutrophils Relative %: 68.6 % (ref 43.0–77.0)
Platelets: 237 10*3/uL (ref 150.0–400.0)
WBC: 8.3 10*3/uL (ref 4.5–10.5)

## 2012-06-22 LAB — BASIC METABOLIC PANEL
BUN: 6 mg/dL (ref 6–23)
Chloride: 96 mEq/L (ref 96–112)
Creatinine, Ser: 0.7 mg/dL (ref 0.4–1.2)

## 2012-06-22 LAB — HEMOGLOBIN A1C: Hgb A1c MFr Bld: 7.3 % — ABNORMAL HIGH (ref 4.6–6.5)

## 2012-06-22 MED ORDER — GLIMEPIRIDE 2 MG PO TABS: 2.0000 mg | ORAL_TABLET | Freq: Every day | ORAL | Status: DC

## 2012-06-22 NOTE — Assessment & Plan Note (Signed)
Follows with neuro local and at [facility] - symptoms stable The current medical regimen is effective;  continue present plan and medications.  

## 2012-06-22 NOTE — Patient Instructions (Signed)
It was good to see you today. we'll make referral to wound care center for your leg . Our office will contact you regarding appointment(s) once made. No evidence for infection so will hold antibiotics continue steroid cream and dressing changes with daily compression hose and leg elevation as discussed Test(s) ordered today. Your results will be released to MyChart (or called to you) after review, usually within 72hours after test completion. If any changes need to be made, you will be notified at that same time. Please schedule followup in 6 months, call sooner if problems.

## 2012-06-22 NOTE — Progress Notes (Signed)
Subjective:    Patient ID: Tamara Perez, female    DOB: 1933/06/04, 78 y.o.   MRN: [id number]  HPI   here for follow up RLE venous stasis dermatitis Onset skin "problems" early 2013 -  has seen derm for same (03/2012 - dx chronic venous stasis dermatitis) Denies pain or fever no change in chronic edema/swelling  Also reviewed chronic medical issues:   dementia - follows with local neuro and [facility] -  s/p PET 2010 scan showing "regular" Alz type, now on aricept for same and tol well - no adv se  in ALF since Jul 2011 -overall irritability is much improved on paxil   breast ca hx - 2004 dx s/p tamoxifen thru 01/2010 (39yr post chemo done) -  folowing with onc for same   weight gain - over 40 lbs since move to [place] in 2010-  pt attributes to antidepressant meds -  reports she does not participate in any exercise - diet predominately simple carbs and sweets  no bowel changes   ankle edema - see above, chronic lymphedema R>L - previously controlled with lasix - eval summer 2011 neg for cardiac or liver or kidney problems -no leg cramping - no shortness of breath or dyspnea on exertion   depression - see dementia above - reports compliance with ongoing medical treatment and no changes in medication dose or frequency. denies adverse side effects related to current therapy. less irritable, less sad, no tearfulness - family agrees    Past Medical History  Diagnosis Date  . ADENOCARCINOMA, BREAST   . DYSLIPIDEMIA   . DEMENTIA   . DEPRESSION   . ASTHMA   . GAIT DISTURBANCE   . Edema   . URINARY INCONTINENCE   . TRANSIENT ISCHEMIC ATTACK, HX OF   . DIVERTICULITIS, HX OF   . HYPERGLYCEMIA   . Venous stasis dermatitis     R>L LE, chronic    Review of Systems  Constitutional: Positive for fatigue. Negative for fever and chills.  Respiratory: Negative for cough and shortness of breath.   Cardiovascular: Positive for leg swelling (chronic). Negative for chest pain.         Objective:   Physical Exam  BP 138/72  Pulse 95  Temp 98.8 F (37.1 C) (Oral)  Ht 5\' 2"  (1.575 m)  Wt 182 lb (82.555 kg)  BMI 33.29 kg/m2  SpO2 95% Wt Readings from Last 3 Encounters:  06/22/12 182 lb (82.555 kg)  05/20/12 181 lb 6.4 oz (82.283 kg)  02/14/12 185 lb (83.915 kg)   Constitutional: She is overweight - appears well-developed and well-nourished. No distress.  driver at side Neck: Normal range of motion. Neck supple. No JVD present. No thyromegaly present.  Cardiovascular: Normal rate, regular rhythm and normal heart sounds.  No murmur heard. chronic 1+ BLE edema, R>L (unchanged). Pulmonary/Chest: Effort normal and breath sounds normal. No respiratory distress. She has no wheezes.  Skin: 5cm dried "scab" RLE posterior medial edge - no erythema or purulence - chronic patchy nodular bright pink skin circumferential around distal RLE at calf - dry and scaling skin - other skin is warm and dry. No rash noted. No erythema.  Psychiatric: She has a normal mood and affect. Her behavior is normal. Judgment and thought content repetitive (baseline).   Lab Results  Component Value Date   WBC 8.8 11/15/2011   HGB 10.3* 11/15/2011   HCT 43.6 11/15/2011   PLT 147* 11/15/2011   GLUCOSE 123* 11/15/2011   CHOL 199  11/08/2010   TRIG 102.0 11/08/2010   HDL 39.80 11/08/2010   LDLCALC 139* 11/08/2010   ALT 34 11/15/2011   AST 44* 11/15/2011   NA 136 11/15/2011   K 5.1 11/15/2011   CL 97 11/15/2011   CREATININE 0.71 11/15/2011   BUN 6 11/15/2011   CO2 30 11/15/2011   TSH 3.69 11/20/2011   HGBA1C 6.9* 11/20/2011      Assessment & Plan:  RLE venous insuff dermatitis - no evidence for infection/cellulitis  s/p derm eval 03/2012 for same Oc here 05/2012: large blister removed under sterile condition via drainage with 25g syringe and removal of dead skin.  Now residual "scab" lesion despite wrapping and HH care - refer to wound care center -   will check Bmet  Before increasing Lasix/Kdur doses. Continue high dose  bid steroid cream and compression hose/elevation weight loss advised

## 2012-06-22 NOTE — Addendum Note (Signed)
Addended by: Rene Paci A on: 06/22/2012 08:40 PM   Modules accepted: Orders

## 2012-06-22 NOTE — Assessment & Plan Note (Signed)
Chronic - related to venous insuff and ongoing weight gain Noncompliant with compression hose and diet continue present medications: Lasix 60 mg through October 17, then to resume 40 mg daily maintenance therapy Check electrolytes now

## 2012-06-22 NOTE — Assessment & Plan Note (Signed)
Lab Results  Component Value Date   HGBA1C 6.9* 11/20/2011   Recheck a1c now, tx as needed - advised attn to diet and weight (complicated adherence by dementia)

## 2012-07-01 ENCOUNTER — Encounter (HOSPITAL_BASED_OUTPATIENT_CLINIC_OR_DEPARTMENT_OTHER): Payer: Medicare Other | Attending: General Surgery

## 2012-07-01 DIAGNOSIS — L02419 Cutaneous abscess of limb, unspecified: Secondary | ICD-10-CM | POA: Insufficient documentation

## 2012-07-01 DIAGNOSIS — R269 Unspecified abnormalities of gait and mobility: Secondary | ICD-10-CM | POA: Insufficient documentation

## 2012-07-01 DIAGNOSIS — F0390 Unspecified dementia without behavioral disturbance: Secondary | ICD-10-CM | POA: Insufficient documentation

## 2012-07-01 DIAGNOSIS — F329 Major depressive disorder, single episode, unspecified: Secondary | ICD-10-CM | POA: Insufficient documentation

## 2012-07-01 DIAGNOSIS — C50919 Malignant neoplasm of unspecified site of unspecified female breast: Secondary | ICD-10-CM | POA: Insufficient documentation

## 2012-07-01 DIAGNOSIS — Z7982 Long term (current) use of aspirin: Secondary | ICD-10-CM | POA: Insufficient documentation

## 2012-07-01 DIAGNOSIS — Z8673 Personal history of transient ischemic attack (TIA), and cerebral infarction without residual deficits: Secondary | ICD-10-CM | POA: Insufficient documentation

## 2012-07-01 DIAGNOSIS — I872 Venous insufficiency (chronic) (peripheral): Secondary | ICD-10-CM | POA: Insufficient documentation

## 2012-07-01 DIAGNOSIS — F3289 Other specified depressive episodes: Secondary | ICD-10-CM | POA: Insufficient documentation

## 2012-07-01 DIAGNOSIS — Z79899 Other long term (current) drug therapy: Secondary | ICD-10-CM | POA: Insufficient documentation

## 2012-07-02 ENCOUNTER — Other Ambulatory Visit: Payer: Self-pay

## 2012-07-02 MED ORDER — DONEPEZIL HCL 10 MG PO TABS: 10.0000 mg | ORAL_TABLET | Freq: Every day | ORAL | Status: DC

## 2012-07-02 MED ORDER — VITAMIN D3 25 MCG (1000 UT) PO CAPS: 2000.0000 [IU] | ORAL_CAPSULE | Freq: Every day | ORAL | Status: DC

## 2012-07-02 NOTE — Progress Notes (Signed)
[facility]  NAME:  HARING, FRANCIS                   ACCOUNT NO.:  MEDICAL RECORD NO.:                     DATE OF BIRTH:  PHYSICIAN:  Ardath Sax, M.D.           VISIT DATE:                                  OFFICE VISIT   Tamara Perez is a 78 year old lady who has many medical diagnosis including adenocarcinoma of the breast for which she had axillary node dissection and lumpectomy.  She is presently on tamoxifen, is also reported that she has depression, dementia, gait disturbance and she has had some transient ischemic attacks.  Her blood work that I see here, CBC and her CMP are normal.  She is sent here by her doctor because of her persistent swelling of her right leg, presumably venous stasis with secondary cellulitis.  Her glucose was noted to be 129.  This lady on examination has a temperature of 98.6, pulse is 90, blood pressure 140/80.  She appears alert, although at times she appears to be appropriate.  She is here with her brother who reports she takes medicine daily a quite a bit.  She takes Lasix, potassium, Travatan, Caltrate plus, aspirin, donepezil, gabapentin, meclizine, mirtazapine __________  Combivent and takes hydrocortisone also.  On examination, this lady's right leg has no true ulcers but it looks like she has got some superficial thinned out areas of erythema that look like they were ulcers recently.  She definitely has a look of venous stasis in this leg.  I can feel pulses.  The foot is warm, has good capillary refill, but looks like venous stasis with secondary cellulitis.  I put her on doxycycline and we put her in an Unna boot and we will take a look at her next week.  So her diagnosis for being here is a venous stasis with secondary cellulitis, right leg.     Ardath Sax, M.D.     PP/MEDQ  D:  07/01/2012  T:  07/01/2012  Job:  [id number]

## 2012-07-20 ENCOUNTER — Other Ambulatory Visit: Payer: Self-pay | Admitting: *Deleted

## 2012-07-20 MED ORDER — PAROXETINE HCL 20 MG PO TABS: 20.0000 mg | ORAL_TABLET | ORAL | Status: AC

## 2012-07-20 MED ORDER — CALCIUM CARBONATE-VITAMIN D 600-400 MG-UNIT PO TABS: 1.0000 | ORAL_TABLET | Freq: Every day | ORAL | Status: DC

## 2012-07-27 DIAGNOSIS — Z0279 Encounter for issue of other medical certificate: Secondary | ICD-10-CM

## 2012-07-28 ENCOUNTER — Other Ambulatory Visit: Payer: Self-pay | Admitting: *Deleted

## 2012-07-28 MED ORDER — POTASSIUM CHLORIDE CRYS ER 20 MEQ PO TBCR: 20.0000 meq | EXTENDED_RELEASE_TABLET | Freq: Every day | ORAL | Status: DC

## 2012-07-28 NOTE — Telephone Encounter (Signed)
R'cd fax from [facility] for refill of Klor Con.

## 2012-07-30 ENCOUNTER — Telehealth: Payer: Self-pay | Admitting: Internal Medicine

## 2012-07-30 NOTE — Telephone Encounter (Signed)
Yes - FL2 on both Mr and Mrs Kautzman completed 07/29/12 - check with Eber Jones - thanks

## 2012-07-30 NOTE — Telephone Encounter (Signed)
Forwarding msg to carolyn...lmb

## 2012-07-30 NOTE — Telephone Encounter (Signed)
Patients daughter is calling to have a new FL2 faxed to [facility] on [address], she says they have been calling for two weeks and she would like a call when this is completed

## 2012-07-30 NOTE — Telephone Encounter (Signed)
Haven't seen FL2 form. Dr. Felicity Coyer have you fill out FL2/..lmb

## 2012-07-31 NOTE — Telephone Encounter (Signed)
FL-2 ready for pick up.  I left a message for Brett Canales (the paperwork person for the nursing home) to notify him the forms were ready.

## 2012-08-03 ENCOUNTER — Other Ambulatory Visit: Payer: Self-pay | Admitting: *Deleted

## 2012-08-03 MED ORDER — MECLIZINE HCL 25 MG PO TABS: 25.0000 mg | ORAL_TABLET | Freq: Every day | ORAL | Status: DC

## 2012-08-04 ENCOUNTER — Other Ambulatory Visit: Payer: Self-pay | Admitting: *Deleted

## 2012-08-04 MED ORDER — BECLOMETHASONE DIPROPIONATE 80 MCG/ACT IN AERS: 2.0000 | INHALATION_SPRAY | Freq: Two times a day (BID) | RESPIRATORY_TRACT | Status: DC

## 2012-08-04 NOTE — Telephone Encounter (Signed)
R'cd fax from [facility] for refill of Qvar.

## 2012-08-05 ENCOUNTER — Other Ambulatory Visit: Payer: Self-pay | Admitting: *Deleted

## 2012-08-05 ENCOUNTER — Encounter (HOSPITAL_BASED_OUTPATIENT_CLINIC_OR_DEPARTMENT_OTHER): Payer: Medicare Other

## 2012-08-05 MED ORDER — ASPIRIN 81 MG PO TABS: 81.0000 mg | ORAL_TABLET | Freq: Every day | ORAL | Status: DC

## 2012-08-17 ENCOUNTER — Telehealth: Payer: Self-pay | Admitting: Internal Medicine

## 2012-08-17 NOTE — Telephone Encounter (Signed)
Done hardcopy to robin  

## 2012-08-17 NOTE — Telephone Encounter (Signed)
[facility] needs an order faxed to  [phone] for the patient to get her flu shot

## 2012-08-18 NOTE — Telephone Encounter (Signed)
Faxed to [facility] 

## 2012-08-21 ENCOUNTER — Other Ambulatory Visit: Payer: Self-pay | Admitting: *Deleted

## 2012-08-21 MED ORDER — MIRTAZAPINE 30 MG PO TABS: 60.0000 mg | ORAL_TABLET | Freq: Every day | ORAL | Status: DC

## 2012-08-24 ENCOUNTER — Other Ambulatory Visit: Payer: Self-pay | Admitting: *Deleted

## 2012-08-24 MED ORDER — VITAMIN D3 25 MCG (1000 UT) PO CAPS: 2000.0000 [IU] | ORAL_CAPSULE | Freq: Every day | ORAL | Status: DC

## 2012-09-04 ENCOUNTER — Other Ambulatory Visit: Payer: Self-pay | Admitting: *Deleted

## 2012-09-04 MED ORDER — NITROFURANTOIN MACROCRYSTAL 50 MG PO CAPS: 50.0000 mg | ORAL_CAPSULE | Freq: Every day | ORAL | Status: DC

## 2012-09-21 ENCOUNTER — Other Ambulatory Visit: Payer: Self-pay | Admitting: *Deleted

## 2012-09-21 MED ORDER — BECLOMETHASONE DIPROPIONATE 80 MCG/ACT IN AERS: 2.0000 | INHALATION_SPRAY | Freq: Two times a day (BID) | RESPIRATORY_TRACT | Status: DC

## 2012-09-25 DIAGNOSIS — Z0279 Encounter for issue of other medical certificate: Secondary | ICD-10-CM

## 2012-09-30 ENCOUNTER — Other Ambulatory Visit: Payer: Self-pay | Admitting: *Deleted

## 2012-09-30 MED ORDER — GABAPENTIN 300 MG PO CAPS: 300.0000 mg | ORAL_CAPSULE | Freq: Every day | ORAL | Status: DC

## 2012-12-22 ENCOUNTER — Non-Acute Institutional Stay (SKILLED_NURSING_FACILITY): Payer: Medicare Other | Admitting: Adult Health

## 2012-12-22 DIAGNOSIS — G629 Polyneuropathy, unspecified: Secondary | ICD-10-CM

## 2012-12-22 DIAGNOSIS — J45909 Unspecified asthma, uncomplicated: Secondary | ICD-10-CM

## 2012-12-22 DIAGNOSIS — R609 Edema, unspecified: Secondary | ICD-10-CM

## 2012-12-22 DIAGNOSIS — N390 Urinary tract infection, site not specified: Secondary | ICD-10-CM

## 2012-12-22 DIAGNOSIS — R42 Dizziness and giddiness: Secondary | ICD-10-CM

## 2012-12-22 DIAGNOSIS — F068 Other specified mental disorders due to known physiological condition: Secondary | ICD-10-CM

## 2012-12-22 DIAGNOSIS — F329 Major depressive disorder, single episode, unspecified: Secondary | ICD-10-CM

## 2012-12-22 DIAGNOSIS — Z8679 Personal history of other diseases of the circulatory system: Secondary | ICD-10-CM

## 2012-12-22 DIAGNOSIS — G609 Hereditary and idiopathic neuropathy, unspecified: Secondary | ICD-10-CM

## 2013-01-22 ENCOUNTER — Encounter: Payer: Self-pay | Admitting: Nurse Practitioner

## 2013-01-22 ENCOUNTER — Non-Acute Institutional Stay (SKILLED_NURSING_FACILITY): Payer: Medicare Other | Admitting: Nurse Practitioner

## 2013-01-22 DIAGNOSIS — E1149 Type 2 diabetes mellitus with other diabetic neurological complication: Secondary | ICD-10-CM

## 2013-01-22 DIAGNOSIS — E785 Hyperlipidemia, unspecified: Secondary | ICD-10-CM

## 2013-01-22 DIAGNOSIS — R609 Edema, unspecified: Secondary | ICD-10-CM

## 2013-01-22 DIAGNOSIS — F068 Other specified mental disorders due to known physiological condition: Secondary | ICD-10-CM

## 2013-01-22 NOTE — Progress Notes (Signed)
Patient ID: Tamara Perez, female   DOB: 09-24-1932, 79 y.o.   MRN: [id number]  Chief Complaint: medical management of chronic conditions   HPI: 79 yo female pt with hx of  dementia, asthma, depression, vertigo and edema.  Seen today for routine visit. Currently without complaints and staff without concerns at this time.     REASSESSMENT OF ONGOING PROBLEMS:   268-VITAMIN D DEFICIENCY  takes vitamin d 1000 units daily   290.40-DEMENTIA, VASCULAR, UNCOMPLICATED The Alzheimer's disease symptoms have not changed. The patient has had little change in behavior.No complications noted from the medication presently being used. takes aricept 10 mg nightly  takes asa 81 mg daily  311-DEPRESSIVE DISORDER NEC The depression remains stable.No complications noted from the medication presently being used.paxil 20 mg daily, takes remeron 45mg  nightly 356.9-NEUROPATHY, PERIPHERAL The peripheral neuropathy is stable.No complications noted from the medication presently being used.takes neurontin 300 mg daily has vicodin 5/325 mg every 4 hours as needed  365.9-GLAUCOMA The glaucoma symptoms are stable.No complications noted from the medication presently being used.take travatan z each eye daily  493.90-UNSPECIFIED ASTHMA, WITHOUT MENTION OF STATUS ASTH The asthma is stable.No complications noted from the medication presently being used.takes Qvar 2 puff twice daily takes combivent 1 puff three times daily as needed  780.4-VERTIGO The vertigo is stable.No complications noted from the medication presently being used. is taking meclizine 25 mg nightly  782.3-EDEMA The edema is stable.No complications noted from the medication presently being used.takes lasix 40 mg daily and k+ 20 meq daily   Review of Systems:  Review of Systems  Constitutional: Positive for weight loss (still with increased BMI). Negative for fever, chills and malaise/fatigue.  HENT: Negative.   Respiratory: Negative for cough and shortness of  breath.   Cardiovascular: Negative for chest pain, palpitations and leg swelling.  Gastrointestinal: Negative for abdominal pain, diarrhea and constipation.  Genitourinary: Negative for dysuria, urgency and frequency.  Musculoskeletal: Negative for myalgias.  Skin: Negative.   Neurological: Negative for dizziness and weakness.  Psychiatric/Behavioral: Negative for depression. The patient is not nervous/anxious and does not have insomnia.     Medications: Patient's Medications  New Prescriptions   No medications on file  Previous Medications   ALBUTEROL-IPRATROPIUM (COMBIVENT) 18-103 MCG/ACT INHALER    Inhale 2 puffs into the lungs 3 (three) times daily.   ASPIRIN 81 MG TABLET    Take 1 tablet (81 mg total) by mouth daily.   BECLOMETHASONE (QVAR) 80 MCG/ACT INHALER    Inhale 1 puff into the lungs 2 (two) times daily.   CALCIUM CARBONATE-VITAMIN D (CALTRATE 600+D) 600-400 MG-UNIT PER TABLET    Take 1 tablet by mouth daily.   CHOLECALCIFEROL (VITAMIN D3) 1000 UNITS CAPS    Take 2 capsules (2,000 Units total) by mouth daily.   DONEPEZIL (ARICEPT) 10 MG TABLET    Take 1 tablet (10 mg total) by mouth at bedtime.   FLUOCINONIDE CREAM (LIDEX) 0.05 %    Apply topically 2 (two) times daily as needed.   FUROSEMIDE (LASIX) 20 MG TABLET    Take 40 mg by mouth daily.    GABAPENTIN (NEURONTIN) 300 MG CAPSULE    Take 1 capsule (300 mg total) by mouth at bedtime.   HYDROCODONE-ACETAMINOPHEN (NORCO) 5-325 MG PER TABLET    Take 1 tablet by mouth every 4 (four) hours as needed. For pain   MECLIZINE (ANTIVERT) 25 MG TABLET    Take 1 tablet (25 mg total) by mouth at bedtime.  NITROFURANTOIN (MACRODANTIN) 50 MG CAPSULE    Take 1 capsule (50 mg total) by mouth at bedtime.   PAROXETINE (PAXIL) 20 MG TABLET    Take 1 tablet (20 mg total) by mouth every morning.   POTASSIUM CHLORIDE SA (K-DUR,KLOR-CON) 20 MEQ TABLET    Take 1 tablet (20 mEq total) by mouth daily.   TRAVOPROST, BENZALKONIUM, (TRAVATAN) 0.004 %  OPHTHALMIC SOLUTION    Place 1 drop into both eyes daily.  Modified Medications   Modified Medication Previous Medication   MIRTAZAPINE (REMERON) 30 MG TABLET mirtazapine (REMERON) 30 MG tablet      Take 45 mg by mouth at bedtime.    Take 2 tablets (60 mg total) by mouth at bedtime.  Discontinued Medications   BECLOMETHASONE (QVAR) 80 MCG/ACT INHALER    Inhale 2 puffs into the lungs 2 (two) times daily.   FUROSEMIDE (LASIX) 40 MG TABLET    Take 40 mg by mouth daily.   GLIMEPIRIDE (AMARYL) 2 MG TABLET    Take 1 tablet (2 mg total) by mouth daily before breakfast.     Physical Exam: Physical Exam  Nursing note and vitals reviewed. Constitutional: She is oriented to person, place, and time and well-developed, well-nourished, and in no distress. No distress.  Cardiovascular: Normal rate, regular rhythm, normal heart sounds and intact distal pulses.   Pulmonary/Chest: Effort normal and breath sounds normal.  Abdominal: Soft. Bowel sounds are normal. She exhibits no distension. There is no tenderness.  Musculoskeletal: Normal range of motion. She exhibits no edema and no tenderness.  Neurological: She is alert and oriented to person, place, and time.  Skin: Skin is warm and dry. She is not diaphoretic. No erythema.     Filed Vitals:   01/22/13 1521  BP: 127/75  Pulse: 96  Temp: 98 F (36.7 C)  Resp: 18      Labs reviewed: Basic Metabolic Panel:  Recent Labs  86/57/84 1402  NA 135  K 4.4  CL 96  CO2 30  GLUCOSE 129*  BUN 6  CREATININE 0.7  CALCIUM 9.5    Liver Function Tests: No results found for this basename: AST, ALT, ALKPHOS, BILITOT, PROT, ALBUMIN,  in the last 8760 hours  CBC:  Recent Labs  06/22/12 1402  WBC 8.3  NEUTROABS 5.7  HGB 14.9  HCT 45.8  MCV 93.4  PLT 237.0    Significant Diagnostic Results:  10/21/12: vit D 47  Sodium 142, potassium 4.6, glucose 151, BUN 10, Cr 0.78  Wbc 6.3, rbc 4.47, hgb 13.3, hct 40.1  Cholesterol 228, triglyceride  140, hdl 30, ldl 170  TSH 4.232    Assessment/Plan Type II or unspecified type diabetes mellitus with neurological manifestations, uncontrolled(250.62) Will follow up A1C  DEMENTIA currently stable. Will cont to monitor and cont current medications   EDEMA Edema has been stable- will cont current medications  DYSLIPIDEMIA Patients dyslipidema is stable; continue current regimen. Will monitor and make changes as necessary.     Labs ordered: A1c, BMP next lab day

## 2013-02-09 NOTE — Assessment & Plan Note (Signed)
Will follow up A1C

## 2013-02-09 NOTE — Assessment & Plan Note (Signed)
Edema has been stable- will cont current medications

## 2013-02-09 NOTE — Assessment & Plan Note (Signed)
Patients dyslipidema is stable; continue current regimen. Will monitor and make changes as necessary.

## 2013-02-09 NOTE — Assessment & Plan Note (Signed)
currently stable. Will cont to monitor and cont current medications

## 2013-02-26 ENCOUNTER — Encounter: Payer: Self-pay | Admitting: Adult Health

## 2013-02-26 DIAGNOSIS — R42 Dizziness and giddiness: Secondary | ICD-10-CM | POA: Insufficient documentation

## 2013-02-26 DIAGNOSIS — N390 Urinary tract infection, site not specified: Secondary | ICD-10-CM | POA: Insufficient documentation

## 2013-02-26 DIAGNOSIS — E1142 Type 2 diabetes mellitus with diabetic polyneuropathy: Secondary | ICD-10-CM | POA: Insufficient documentation

## 2013-02-26 NOTE — Assessment & Plan Note (Signed)
Is stable will continue remeron 45 mg nightly; will continue paxil 20 mg daily she is doing well with this regimen

## 2013-02-26 NOTE — Assessment & Plan Note (Signed)
No change in her status will continue aricept 10 mg daily

## 2013-02-26 NOTE — Assessment & Plan Note (Signed)
Is stable will continue neurontin 300 mg nightly

## 2013-02-26 NOTE — Assessment & Plan Note (Signed)
Is stable will continue asa 81 mg daily  

## 2013-02-26 NOTE — Assessment & Plan Note (Signed)
Is stable will continue qvar 88 mcg 2 puffs twice daily and combivent 1 puff three times daily as needed will monitor

## 2013-02-26 NOTE — Assessment & Plan Note (Signed)
Is without vertigo; will continue meclizine 25 mg nightly

## 2013-02-26 NOTE — Assessment & Plan Note (Signed)
Is stable will continue lasix 40 mg daily with k+ 20 meq daily

## 2013-02-26 NOTE — Progress Notes (Signed)
Patient ID: Tamara Perez, female   DOB: 07-02-1933, 79 y.o.   MRN: [id number]  [facility]  Allergies  Allergen Reactions  . Codeine Other (See Comments)    Reaction unknown     Chief Complaint  Patient presents with  . Medical Managment of Chronic Issues    HPI:  She is being seen for the management of chronic illnesses. She is doing well; states she is feeling good; is voicing no complaints. The staff is not reporting any changes or any concerns as well.    Past Medical History  Diagnosis Date  . ADENOCARCINOMA, BREAST   . DYSLIPIDEMIA   . DEMENTIA   . DEPRESSION   . ASTHMA   . GAIT DISTURBANCE   . Edema   . URINARY INCONTINENCE   . TRANSIENT ISCHEMIC ATTACK, HX OF   . DIVERTICULITIS, HX OF   . HYPERGLYCEMIA   . Venous stasis dermatitis     R>L LE, chronic  . Type II or unspecified type diabetes mellitus with neurological manifestations, uncontrolled(250.62)     Qualifier: Diagnosis of  By: Felicity Coyer MD, Raenette Rover     Past Surgical History  Procedure Laterality Date  . Breast surgery      05/31/04 2 lumps removed, s/p XRT and chemo  . Abdominal hysterectomy    . Breast biopsy  2005  . Urinary sling  2007  . Itp      1991 [facility] [place]    VITAL SIGNS BP 132/68  Pulse 68  Ht 5' (1.524 m)  Wt 175 lb (79.379 kg)  BMI 34.18 kg/m2   Patient's Medications  New Prescriptions   No medications on file  Previous Medications   ALBUTEROL-IPRATROPIUM (COMBIVENT) 18-103 MCG/ACT INHALER    Inhale 2 puffs into the lungs 3 (three) times daily.   ASPIRIN 81 MG TABLET    Take 1 tablet (81 mg total) by mouth daily.   BECLOMETHASONE (QVAR) 80 MCG/ACT INHALER    Inhale 1 puff into the lungs 2 (two) times daily.   CALCIUM CARBONATE-VITAMIN D (CALTRATE 600+D) 600-400 MG-UNIT PER TABLET    Take 1 tablet by mouth daily.   CHOLECALCIFEROL (VITAMIN D3) 1000 UNITS CAPS    Take 2 capsules (2,000 Units total) by mouth daily.   DONEPEZIL  (ARICEPT) 10 MG TABLET    Take 1 tablet (10 mg total) by mouth at bedtime.   FLUOCINONIDE CREAM (LIDEX) 0.05 %    Apply topically 2 (two) times daily as needed.   FUROSEMIDE (LASIX) 20 MG TABLET    Take 40 mg by mouth daily.    GABAPENTIN (NEURONTIN) 300 MG CAPSULE    Take 1 capsule (300 mg total) by mouth at bedtime.   MECLIZINE (ANTIVERT) 25 MG TABLET    Take 1 tablet (25 mg total) by mouth at bedtime.   NITROFURANTOIN (MACRODANTIN) 50 MG CAPSULE    Take 1 capsule (50 mg total) by mouth at bedtime.   PAROXETINE (PAXIL) 20 MG TABLET    Take 1 tablet (20 mg total) by mouth every morning.   POTASSIUM CHLORIDE SA (K-DUR,KLOR-CON) 20 MEQ TABLET    Take 1 tablet (20 mEq total) by mouth daily.   TRAVOPROST, BENZALKONIUM, (TRAVATAN) 0.004 % OPHTHALMIC SOLUTION    Place 1 drop into both eyes daily.  Modified Medications   Modified Medication Previous Medication   MIRTAZAPINE (REMERON) 30 MG TABLET mirtazapine (REMERON) 30 MG tablet      Take 45 mg by mouth at bedtime.  Take 2 tablets (60 mg total) by mouth at bedtime.  Discontinued Medications   BECLOMETHASONE (QVAR) 80 MCG/ACT INHALER    Inhale 2 puffs into the lungs 2 (two) times daily.   FUROSEMIDE (LASIX) 40 MG TABLET    Take 40 mg by mouth daily.   GLIMEPIRIDE (AMARYL) 2 MG TABLET    Take 1 tablet (2 mg total) by mouth daily before breakfast.   HYDROCODONE-ACETAMINOPHEN (NORCO) 5-325 MG PER TABLET    Take 1 tablet by mouth every 4 (four) hours as needed. For pain    SIGNIFICANT DIAGNOSTIC EXAMS  None recent   LABS REVIEWED:  10-21-12: wbc 6.3; hgb 13.3; hct 40.1; mcv 89.7; plt 261; glucose 151; bun 10; creat 0.78; k+ 4.6;  Na++ 142; liver normal albumin 3.6; chol 228; ldl 170; trig 140; tsh 4.232 10-22-12: vit d 47     Review of Systems  Constitutional: Negative for malaise/fatigue.  Respiratory: Negative for cough and shortness of breath.   Cardiovascular: Negative for chest pain and leg swelling.  Gastrointestinal: Negative for  heartburn, abdominal pain and constipation.  Musculoskeletal: Negative for myalgias and joint pain.  Skin: Negative.   Neurological: Negative for headaches.  Psychiatric/Behavioral: Negative for depression. The patient does not have insomnia.     Physical Exam  Constitutional: She appears well-developed and well-nourished.  Overweight   Neck: Neck supple. No JVD present. No thyromegaly present.  Cardiovascular: Normal rate, regular rhythm and intact distal pulses.   Respiratory: Breath sounds normal. No respiratory distress.  GI: Soft. Bowel sounds are normal. She exhibits no distension. There is no tenderness.  Musculoskeletal: Normal range of motion. She exhibits no edema.  Uses walker  Neurological: She is alert.  Skin: Skin is warm and dry.       ASSESSMENT/ PLAN:  ASTHMA Is stable will continue qvar 88 mcg 2 puffs twice daily and combivent 1 puff three times daily as needed will monitor  DEMENTIA No change in her status will continue aricept 10 mg daily   DEPRESSION Is stable will continue remeron 45 mg nightly; will continue paxil 20 mg daily she is doing well with this regimen  EDEMA Is stable will continue lasix 40 mg daily with k+ 20 meq daily   TRANSIENT ISCHEMIC ATTACK, HX OF Is stable will continue asa 81 mg daily   Urinary tract infection, site not specified No recent infection present will continue macrobid 50 mg daily   Peripheral neuropathy Is stable will continue neurontin 300 mg nightly   Vertigo Is without vertigo; will continue meclizine 25 mg nightly

## 2013-02-26 NOTE — Assessment & Plan Note (Signed)
No recent infection present will continue macrobid 50 mg daily

## 2013-03-18 ENCOUNTER — Non-Acute Institutional Stay (SKILLED_NURSING_FACILITY): Payer: Medicare Other | Admitting: Nurse Practitioner

## 2013-03-18 ENCOUNTER — Encounter: Payer: Self-pay | Admitting: Nurse Practitioner

## 2013-03-18 DIAGNOSIS — F329 Major depressive disorder, single episode, unspecified: Secondary | ICD-10-CM

## 2013-03-18 DIAGNOSIS — R609 Edema, unspecified: Secondary | ICD-10-CM

## 2013-03-18 DIAGNOSIS — F3289 Other specified depressive episodes: Secondary | ICD-10-CM

## 2013-03-18 DIAGNOSIS — E1149 Type 2 diabetes mellitus with other diabetic neurological complication: Secondary | ICD-10-CM

## 2013-03-18 DIAGNOSIS — F068 Other specified mental disorders due to known physiological condition: Secondary | ICD-10-CM

## 2013-03-18 NOTE — Progress Notes (Signed)
Patient ID: Tamara Perez, female   DOB: 1932-09-23, 79 y.o.   MRN: [id number]  Nursing Home Location:  [facility]   Place of Service: SNF [id number])   Chief Complaint: medical management of chronic conditions   HPI:  79 yo female who is a long term resident of [facility]  with hx of dementia, asthma, depression, vertigo and edema. Seen today for routine visit.  Doing well in current living enviroment; staff notes increase memory loss but overall doing well.  pt is without complaints and staff without concerns at this time.   REASSESSMENT OF ONGOING PROBLEMS:  VITAMIN D DEFICIENCY takes vitamin d 1000 units daily  DEMENTIA, VASCULAR, UNCOMPLICATED The Alzheimer's disease symptoms have not changed. The patient has had little change in behavior.No complications noted from the medication presently being used. takes aricept 10 mg nightly DEPRESSIVE DISORDER NEC The depression remains stable.No complications noted from the medication presently being used.paxil 20 mg daily, takes remeron 45mg  nightly  NEUROPATHY, PERIPHERAL The peripheral neuropathy is stable.No complications noted from the medication presently being used.takes neurontin 300 mg daily has vicodin 5/325 mg every 4 hours as needed  GLAUCOMA The glaucoma symptoms are stable.No complications noted from the medication presently being used.take travatan z each eye daily  UNSPECIFIED ASTHMA, WITHOUT MENTION OF STATUS ASTH The asthma is stable.No complications noted from the medication presently being used.takes Qvar 2 puff twice daily takes combivent 1 puff three times daily as needed  VERTIGO The vertigo is stable.No complications noted from the medication presently being used. is taking meclizine 25 mg nightly  EDEMA The edema is stable.No complications noted from the medication presently being used.takes lasix 40 mg daily and k+ 20 meq daily  Diabetes- currently off all medications    Review of Systems:   Review of  Systems  Constitutional: Negative for fever, chills and malaise/fatigue.  HENT: Negative.   Eyes: Negative.   Respiratory: Negative for cough, shortness of breath and wheezing.   Cardiovascular: Negative for chest pain and palpitations.  Gastrointestinal: Negative for heartburn, abdominal pain, diarrhea and constipation.  Genitourinary: Negative for dysuria, urgency and frequency.  Musculoskeletal: Negative for myalgias and falls.  Skin: Negative.   Neurological: Negative for dizziness and weakness.  Psychiatric/Behavioral: Positive for memory loss. Negative for depression. The patient is not nervous/anxious and does not have insomnia.      Medications: Patient's Medications  New Prescriptions   No medications on file  Previous Medications   ALBUTEROL-IPRATROPIUM (COMBIVENT) 18-103 MCG/ACT INHALER    Inhale 2 puffs into the lungs 3 (three) times daily.   ASPIRIN 81 MG TABLET    Take 1 tablet (81 mg total) by mouth daily.   BECLOMETHASONE (QVAR) 80 MCG/ACT INHALER    Inhale 1 puff into the lungs 2 (two) times daily.   CALCIUM CARBONATE-VITAMIN D (CALTRATE 600+D) 600-400 MG-UNIT PER TABLET    Take 1 tablet by mouth daily.   CHOLECALCIFEROL (VITAMIN D3) 1000 UNITS CAPS    Take 2 capsules (2,000 Units total) by mouth daily.   DONEPEZIL (ARICEPT) 10 MG TABLET    Take 1 tablet (10 mg total) by mouth at bedtime.   FLUOCINONIDE CREAM (LIDEX) 0.05 %    Apply topically 2 (two) times daily as needed.   FUROSEMIDE (LASIX) 20 MG TABLET    Take 40 mg by mouth daily.    GABAPENTIN (NEURONTIN) 300 MG CAPSULE    Take 1 capsule (300 mg total) by mouth at bedtime.   MECLIZINE (ANTIVERT)  25 MG TABLET    Take 1 tablet (25 mg total) by mouth at bedtime.   MIRTAZAPINE (REMERON) 30 MG TABLET    Take 45 mg by mouth at bedtime.   NITROFURANTOIN (MACRODANTIN) 50 MG CAPSULE    Take 1 capsule (50 mg total) by mouth at bedtime.   PAROXETINE (PAXIL) 20 MG TABLET    Take 1 tablet (20 mg total) by mouth every  morning.   POTASSIUM CHLORIDE SA (K-DUR,KLOR-CON) 20 MEQ TABLET    Take 1 tablet (20 mEq total) by mouth daily.   TRAVOPROST, BENZALKONIUM, (TRAVATAN) 0.004 % OPHTHALMIC SOLUTION    Place 1 drop into both eyes daily.  Modified Medications   No medications on file  Discontinued Medications   No medications on file     Physical Exam:  Filed Vitals:   03/18/13 1424  BP: 132/68  Pulse: 88  Temp: 96.9 F (36.1 C)  Resp: 20    Physical Exam  Constitutional: She is well-developed, well-nourished, and in no distress. No distress.  HENT:  Head: Normocephalic and atraumatic.  Mouth/Throat: Oropharynx is clear and moist. No oropharyngeal exudate.  Eyes: Conjunctivae and EOM are normal. Pupils are equal, round, and reactive to light.  Neck: Normal range of motion. Neck supple.  Cardiovascular: Normal rate, regular rhythm and normal heart sounds.   Pulmonary/Chest: Effort normal and breath sounds normal. No respiratory distress.  Abdominal: Soft. Bowel sounds are normal. She exhibits no distension.  Musculoskeletal: Normal range of motion. She exhibits no edema and no tenderness.  Neurological: She is alert.  Skin: Skin is warm and dry. She is not diaphoretic.  Psychiatric: Affect normal.      Labs reviewed: 01/28/13: sodium 138, potassium 4.2, glucose 174, BUN 8, Cr 0.79  hgb A1c 6.6   Assessment/Plan    1.   DEMENTIA  Will add namenda XR at this time to help preserve memory       2.   DEPRESSION 311     Stable at this time will cont current medications    3.   EDEMA 782.3     Stable at this time. Will cont current therapy at this time   4.   Type II or unspecified type diabetes mellitus with neurological manifestations, uncontrolled(250.62)   Stable off all medications at this time. hgb a1c was 6.6 in may

## 2013-06-23 ENCOUNTER — Non-Acute Institutional Stay (SKILLED_NURSING_FACILITY): Payer: Medicare Other | Admitting: Internal Medicine

## 2013-06-23 DIAGNOSIS — F329 Major depressive disorder, single episode, unspecified: Secondary | ICD-10-CM

## 2013-06-23 DIAGNOSIS — F0280 Dementia in other diseases classified elsewhere without behavioral disturbance: Secondary | ICD-10-CM

## 2013-06-23 DIAGNOSIS — J45909 Unspecified asthma, uncomplicated: Secondary | ICD-10-CM

## 2013-06-23 DIAGNOSIS — G629 Polyneuropathy, unspecified: Secondary | ICD-10-CM

## 2013-06-23 DIAGNOSIS — G609 Hereditary and idiopathic neuropathy, unspecified: Secondary | ICD-10-CM

## 2013-06-23 DIAGNOSIS — F3289 Other specified depressive episodes: Secondary | ICD-10-CM

## 2013-06-23 DIAGNOSIS — E1149 Type 2 diabetes mellitus with other diabetic neurological complication: Secondary | ICD-10-CM

## 2013-06-23 NOTE — Progress Notes (Signed)
Patient ID: Tamara Perez, female   DOB: 02-02-1933, 79 y.o.   MRN: [id number] Location:   [facility] Provider:  Gwenith Spitz. Renato Gails, D.O., C.M.D.   Chief Complaint  Patient presents with  . Medical Managment of Chronic Issues    HPI:   79 yo white female here for long term care.  She and her husband [place] when their dementia progressed to require more ADL help.  She is doing well except she vomited once this am.  For some reason, vomit was still on her pillow from the morning.  She says she thinks it was just something she ate that didn't sit well.  She tolerated her lunch and was taking an afternoon nap when I saw her.  She denied abdominal pain, diarrhea or any residual nausea.  Denied hematochezia or melena.  She feels well now.  Her ongoing medical conditions include asthma, AD, depression and vitamin D deficiency.    Review of Systems:  Review of Systems  Constitutional: Negative for fever, chills, weight loss and malaise/fatigue.  Eyes: Negative for blurred vision.  Respiratory: Negative for cough and shortness of breath.   Cardiovascular: Negative for chest pain.  Gastrointestinal: Positive for vomiting. Negative for heartburn, nausea, constipation, blood in stool and melena.  Genitourinary: Negative for dysuria.  Musculoskeletal: Negative for falls and myalgias.  Skin: Negative for rash.  Neurological: Negative for dizziness and headaches.  Psychiatric/Behavioral: Positive for memory loss. Negative for depression.    Medications: Patient's Medications  New Prescriptions   No medications on file  Previous Medications   ALBUTEROL-IPRATROPIUM (COMBIVENT) 18-103 MCG/ACT INHALER    Inhale 2 puffs into the lungs 3 (three) times daily.   ASPIRIN 81 MG TABLET    Take 1 tablet (81 mg total) by mouth daily.   BECLOMETHASONE (QVAR) 80 MCG/ACT INHALER    Inhale 1 puff into the lungs 2 (two) times daily.   CALCIUM CARBONATE-VITAMIN D (CALTRATE 600+D) 600-400  MG-UNIT PER TABLET    Take 1 tablet by mouth daily.   CHOLECALCIFEROL (VITAMIN D3) 1000 UNITS CAPS    Take 2 capsules (2,000 Units total) by mouth daily.   DONEPEZIL (ARICEPT) 10 MG TABLET    Take 1 tablet (10 mg total) by mouth at bedtime.   FLUOCINONIDE CREAM (LIDEX) 0.05 %    Apply topically 2 (two) times daily as needed.   FUROSEMIDE (LASIX) 20 MG TABLET    Take 40 mg by mouth daily.    GABAPENTIN (NEURONTIN) 300 MG CAPSULE    Take 1 capsule (300 mg total) by mouth at bedtime.   MECLIZINE (ANTIVERT) 25 MG TABLET    Take 1 tablet (25 mg total) by mouth at bedtime.   MIRTAZAPINE (REMERON) 30 MG TABLET    Take 45 mg by mouth at bedtime.   NITROFURANTOIN (MACRODANTIN) 50 MG CAPSULE    Take 1 capsule (50 mg total) by mouth at bedtime.   PAROXETINE (PAXIL) 20 MG TABLET    Take 1 tablet (20 mg total) by mouth every morning.   POTASSIUM CHLORIDE SA (K-DUR,KLOR-CON) 20 MEQ TABLET    Take 1 tablet (20 mEq total) by mouth daily.   TRAVOPROST, BENZALKONIUM, (TRAVATAN) 0.004 % OPHTHALMIC SOLUTION    Place 1 drop into both eyes daily.  Modified Medications   No medications on file  Discontinued Medications   No medications on file    Physical Exam: There were no vitals filed for this visit. Physical Exam  Constitutional: She  appears well-developed and well-nourished. No distress.  Obese white female  HENT:  Head: Normocephalic and atraumatic.  Cardiovascular: Normal rate, regular rhythm, normal heart sounds and intact distal pulses.   Pulmonary/Chest: Effort normal and breath sounds normal. No respiratory distress.  Abdominal: Soft. Bowel sounds are normal. She exhibits no distension. There is no tenderness.  Musculoskeletal: Normal range of motion. She exhibits no edema and no tenderness.  Neurological: She is alert.  oriented to person and place,not time  Skin: Skin is warm and dry.   Labs reviewed: Since last routine visit  Significant Diagnostic Results: no new results in past  month  Assessment/Plan 1. Alzheimer's disease -continue current therapy -remains able to do some adl care on her own, but requires significant prompting and some direct assistance -participates in activities  2. Depressive disorder, not elsewhere classified -cont paxil _I question her need for remeron considering her weight--may d/c this, but need to review with dietitian who was not here today  3. Type II or unspecified type diabetes mellitus with neurological manifestations, uncontrolled(250.62) -on diet only at this point--need to f/u on hba1c  4. Peripheral neuropathy -cont gabapentin and monitor renal function  5. Unspecified asthma(493.90) -cont combivent and qvar -stable w/o exacerbation  Family/ staff Communication: staff noted only the episode of vomiting this am

## 2013-06-27 ENCOUNTER — Encounter: Payer: Self-pay | Admitting: Internal Medicine

## 2013-08-02 ENCOUNTER — Encounter: Payer: Self-pay | Admitting: Nurse Practitioner

## 2013-08-02 ENCOUNTER — Non-Acute Institutional Stay (SKILLED_NURSING_FACILITY): Payer: Medicare Other | Admitting: Nurse Practitioner

## 2013-08-02 DIAGNOSIS — F329 Major depressive disorder, single episode, unspecified: Secondary | ICD-10-CM

## 2013-08-02 DIAGNOSIS — F068 Other specified mental disorders due to known physiological condition: Secondary | ICD-10-CM

## 2013-08-02 DIAGNOSIS — G629 Polyneuropathy, unspecified: Secondary | ICD-10-CM

## 2013-08-02 DIAGNOSIS — E1149 Type 2 diabetes mellitus with other diabetic neurological complication: Secondary | ICD-10-CM

## 2013-08-02 DIAGNOSIS — R634 Abnormal weight loss: Secondary | ICD-10-CM

## 2013-08-02 DIAGNOSIS — G609 Hereditary and idiopathic neuropathy, unspecified: Secondary | ICD-10-CM

## 2013-08-02 DIAGNOSIS — J45909 Unspecified asthma, uncomplicated: Secondary | ICD-10-CM

## 2013-08-02 NOTE — Progress Notes (Signed)
Patient ID: Tamara Perez, female   DOB: 11/20/1932, 79 y.o.   MRN: [id number] Nursing Home Location:  [facility]   Place of Service: SNF (31)   Allergies  Allergen Reactions  . Codeine Other (See Comments)    Reaction unknown    Chief Complaint  Patient presents with  . Medical Managment of Chronic Issues    HPI:  79 yo female who is a long term resident of [facility] with hx of dementia, asthma, depression, vertigo and edema. Seen today for routine visit. Doing well in current living enviroment; progressive memory loss but overall doing well. pt is without complaints; nursing without concerns at this time.  REASSESSMENT OF ONGOING PROBLEMS:  VITAMIN D DEFICIENCY takes vitamin d 1000 units daily  DEMENTIA, VASCULAR, UNCOMPLICATED No complications noted from the medication presently being used. takes aricept 10 mg nightly and namenda XR 28 mg daily  DEPRESSIVE DISORDER NEC The depression remains stable.No complications noted from the medication presently being used.paxil 20 mg daily, takes remeron 45mg  nightly  NEUROPATHY, PERIPHERAL The peripheral neuropathy is stable.No complications noted from the medication presently being used.takes neurontin 300 mg daily has vicodin 5/325 mg every 4 hours as needed  GLAUCOMA The glaucoma symptoms are stable.No complications noted from the medication presently being used.take travatan z each eye daily  UNSPECIFIED ASTHMA, WITHOUT MENTION OF STATUS ASTH The asthma is stable.No complications noted from the medication presently being used.takes Qvar 2 puff twice daily takes combivent 1 puff three times daily as needed  VERTIGO The vertigo is stable.No complications noted from the medication presently being used. is taking meclizine 25 mg nightly  EDEMA The edema is stable.No complications noted from the medication presently being used.takes lasix 40 mg daily and k+ 20 meq daily  Diabetes- currently off all medications    Review  of Systems:  Review of Systems  Constitutional: Positive for weight loss. Negative for fever, chills and malaise/fatigue.  Eyes: Negative for blurred vision.  Respiratory: Negative for cough and shortness of breath.   Cardiovascular: Negative for chest pain and leg swelling.  Gastrointestinal: Negative for heartburn, nausea, vomiting, constipation, blood in stool and melena.  Genitourinary: Negative for dysuria.  Musculoskeletal: Negative for falls and myalgias.  Skin: Negative for rash.  Neurological: Negative for dizziness, weakness and headaches.  Psychiatric/Behavioral: Positive for memory loss. Negative for depression. The patient is not nervous/anxious and does not have insomnia.      Past Medical History  Diagnosis Date  . ADENOCARCINOMA, BREAST   . DYSLIPIDEMIA   . DEMENTIA   . DEPRESSION   . ASTHMA   . GAIT DISTURBANCE   . Edema   . URINARY INCONTINENCE   . TRANSIENT ISCHEMIC ATTACK, HX OF   . DIVERTICULITIS, HX OF   . HYPERGLYCEMIA   . Venous stasis dermatitis     R>L LE, chronic  . Type II or unspecified type diabetes mellitus with neurological manifestations, uncontrolled(250.62)     Qualifier: Diagnosis of  By: Felicity Coyer MD, Raenette Rover    Past Surgical History  Procedure Laterality Date  . Breast surgery      05/31/04 2 lumps removed, s/p XRT and chemo  . Abdominal hysterectomy    . Breast biopsy  2005  . Urinary sling  2007  . Itp      1991 [facility] [place]   Social History:   reports that she has never smoked. She does not have any smokeless tobacco history on file. She  reports that she does not drink alcohol or use illicit drugs.  Family History  Problem Relation Age of Onset  . Breast cancer Mother   . Kidney disease Mother   . Stroke Mother   . Prostate cancer Father   . Heart disease Father     Medications: Patient's Medications  New Prescriptions   No medications on file  Previous Medications    ALBUTEROL-IPRATROPIUM (COMBIVENT) 18-103 MCG/ACT INHALER    Inhale 2 puffs into the lungs 3 (three) times daily.   ASPIRIN 81 MG TABLET    Take 1 tablet (81 mg total) by mouth daily.   BECLOMETHASONE (QVAR) 80 MCG/ACT INHALER    Inhale 1 puff into the lungs 2 (two) times daily.   CALCIUM CARBONATE-VITAMIN D (CALTRATE 600+D) 600-400 MG-UNIT PER TABLET    Take 1 tablet by mouth daily.   CHOLECALCIFEROL (VITAMIN D3) 1000 UNITS CAPS    Take 2 capsules (2,000 Units total) by mouth daily.   DONEPEZIL (ARICEPT) 10 MG TABLET    Take 1 tablet (10 mg total) by mouth at bedtime.   FLUOCINONIDE CREAM (LIDEX) 0.05 %    Apply topically 2 (two) times daily as needed.   FUROSEMIDE (LASIX) 20 MG TABLET    Take 40 mg by mouth daily.    GABAPENTIN (NEURONTIN) 300 MG CAPSULE    Take 1 capsule (300 mg total) by mouth at bedtime.   MECLIZINE (ANTIVERT) 25 MG TABLET    Take 1 tablet (25 mg total) by mouth at bedtime.   MEMANTINE HCL ER (NAMENDA XR) 28 MG CP24    Take 28 mg by mouth daily.   MIRTAZAPINE (REMERON) 30 MG TABLET    Take 45 mg by mouth at bedtime.   NITROFURANTOIN (MACRODANTIN) 50 MG CAPSULE    Take 1 capsule (50 mg total) by mouth at bedtime.   PAROXETINE (PAXIL) 20 MG TABLET    Take 1 tablet (20 mg total) by mouth every morning.   POTASSIUM CHLORIDE SA (K-DUR,KLOR-CON) 20 MEQ TABLET    Take 1 tablet (20 mEq total) by mouth daily.   TRAVOPROST, BENZALKONIUM, (TRAVATAN) 0.004 % OPHTHALMIC SOLUTION    Place 1 drop into both eyes daily.  Modified Medications   No medications on file  Discontinued Medications   No medications on file     Physical Exam: Physical Exam  Constitutional: She is well-developed, well-nourished, and in no distress. No distress.  HENT:  Head: Normocephalic and atraumatic.  Mouth/Throat: Oropharynx is clear and moist. No oropharyngeal exudate.  Eyes: Conjunctivae and EOM are normal. Pupils are equal, round, and reactive to light.  Neck: Normal range of motion. Neck supple.   Cardiovascular: Normal rate, regular rhythm and normal heart sounds.   Pulmonary/Chest: Effort normal. No respiratory distress. She has wheezes (right upper lobe).  Abdominal: Soft. Bowel sounds are normal. She exhibits no distension. There is no tenderness.  Musculoskeletal: Normal range of motion. She exhibits no edema and no tenderness.  Neurological: She is alert.  Skin: Skin is warm and dry. She is not diaphoretic.  Psychiatric: Affect normal.  ; Filed Vitals:   08/02/13 0935  BP: 107/67  Pulse: 80  Temp: 98.2 F (36.8 C)  Resp: 16      Labs reviewed: CMP with Estimated GFR    Result: 10/21/2012 2:00 PM   ( Status: F )     C Sodium 142     135-145 mEq/L SLN   Potassium 4.6     3.5-5.3 mEq/L SLN  Chloride 100     96-112 mEq/L SLN   CO2 31     19-32 mEq/L SLN   Glucose 151   H 70-99 mg/dL SLN   BUN 10     1-61 mg/dL SLN   Creatinine 0.96     0.50-1.10 mg/dL SLN   Bilirubin, Total 0.4     0.3-1.2 mg/dL SLN   Alkaline Phosphatase 77     39-117 U/L SLN   AST/SGOT 40   H 0-37 U/L SLN   ALT/SGPT 32     0-35 U/L SLN   Total Protein 6.3     6.0-8.3 g/dL SLN   Albumin 3.6     0.4-5.4 g/dL SLN   Calcium 9.4     0.9-81.1 mg/dL SLN   Est GFR, African American 85      mL/min SLN   Est GFR, NonAfrican American 74      mL/min SLN C CBC with Diff    Result: 10/21/2012 12:19 PM   ( Status: F )       WBC 6.3     4.0-10.5 K/uL SLN   RBC 4.47     3.87-5.11 MIL/uL SLN   Hemoglobin 13.3     12.0-15.0 g/dL SLN   Hematocrit 91.4     36.0-46.0 % SLN   MCV 89.7     78.0-100.0 fL SLN   MCH 29.8     26.0-34.0 pg SLN   MCHC 33.2     30.0-36.0 g/dL SLN   RDW 78.2     95.6-21.3 % SLN   Platelet Count 261     150-400 K/uL SLN   Granulocyte % 51     43-77 % SLN   Absolute Gran 3.2     1.7-7.7 K/uL SLN   Lymph % 34     12-46 % SLN   Absolute Lymph 2.1     0.7-4.0 K/uL SLN   Mono % 9     3-12 % SLN   Absolute Mono 0.6     0.1-1.0 K/uL SLN   Eos % 5     0-5 % SLN   Absolute Eos 0.3      0.0-0.7 K/uL SLN   Baso % 1     0-1 % SLN   Absolute Baso 0.1     0.0-0.1 K/uL SLN   Smear Review Criteria for review not met  SLN   Lipid Profile    Result: 10/21/2012 11:36 AM   ( Status: F )       Cholesterol 228   H 0-200 mg/dL SLN C Triglyceride 086     <150 mg/dL SLN   HDL Cholesterol 30   L >39 mg/dL SLN   Total Chol/HDL Ratio 7.6      Ratio SLN   VLDL Cholesterol (Calc) 28     0-40 mg/dL SLN   LDL Cholesterol (Calc) 170   H 0-99 mg/dL SLN C TSH, Ultrasensitive    Result: 10/21/2012 1:04 PM   ( Status: F )       TSH 4.232   Vitamin D (25-Hydroxy)    Result: 10/22/2012 5:07 AM   ( Status: F )     C Vitamin D (25-Hydroxy) 47     30-89 ng/mL SLN 05/26/13: sodium 142, potassium 4.4, glucose 125, BUN 11, Cr 0.88 Hgb A1c 7.1,   Assessment/Plan 1. ASTHMA -stable on current medications, no signs of worsening asthma or exacerbation; pt with wheezing but denies shortness of breath or cough;  will have nursing monitor closely   2. Type II or unspecified type diabetes mellitus with neurological manifestations, uncontrolled(250.62) -worse then last month however within range for age and co-morbs, currently diet controlled; will cont to monitor   3. Peripheral neuropathy Patient is stable; Will monitor and make changes as necessary.  4. DEMENTIA -conts on namenda and Aricept; staff reports pt does well in current enviroment  5. DEPRESSION Will titrate and dc Remeron at this time; pt on Paxil and Remeron for mood; this appears to be stable at this time, pt with ongoing weight loss however Remeron dose is for depression (will stop this now and may need to use later if weight loss conts at a different dose)   6. Weight loss Pt still eating 75-80% of meals; discussed with RD; current BMI is 31; will stop Remeron at this time knowing it may need to be restarted at a later date. Will get CMP

## 2013-08-31 ENCOUNTER — Non-Acute Institutional Stay (SKILLED_NURSING_FACILITY): Payer: Medicare Other | Admitting: Internal Medicine

## 2013-08-31 ENCOUNTER — Encounter: Payer: Self-pay | Admitting: Internal Medicine

## 2013-08-31 DIAGNOSIS — J45909 Unspecified asthma, uncomplicated: Secondary | ICD-10-CM

## 2013-08-31 DIAGNOSIS — F0280 Dementia in other diseases classified elsewhere without behavioral disturbance: Secondary | ICD-10-CM

## 2013-08-31 DIAGNOSIS — R410 Disorientation, unspecified: Secondary | ICD-10-CM

## 2013-08-31 DIAGNOSIS — R404 Transient alteration of awareness: Secondary | ICD-10-CM

## 2013-08-31 NOTE — Progress Notes (Signed)
Patient ID: Tamara Perez, female   DOB: Apr 14, 1933, 79 y.o.   MRN: [id number]  Location:  [facility] Provider:  Gwenith Spitz. Renato Gails, D.O., C.M.D.  Code Status:  DNR  Chief Complaint  Patient presents with  . Acute Visit    confusion, back pain, staying in room instead of attending activities and going to meals in dining room  . Altered Mental Status    HPI:  79 yo female with h/o obesity, vertigo, asthma, dementia was seen for acute visit due to increased confusion since this am.  She did not eat lunch.  She has been staying in her room instead of attending activities or going to the dining room at meals.  She was evaluated by [facility] RN and was unable to stand when CNAs attempted to get her up. Had c/o back pain.  When I saw her, she denied back pain, but just kept repeating "really bad, really bad" over and over, but could not answer what was really bad.    Review of Systems:  Review of Systems  Constitutional: Positive for malaise/fatigue. Negative for fever and chills.  HENT: Negative for congestion.   Respiratory: Positive for cough. Negative for shortness of breath.   Cardiovascular: Negative for chest pain.  Gastrointestinal: Negative for diarrhea and constipation.  Genitourinary: Positive for flank pain. Negative for dysuria.  Musculoskeletal: Positive for back pain. Negative for falls.  Skin: Negative for rash.  Neurological: Positive for dizziness.  Psychiatric/Behavioral: Positive for memory loss.    Medications: Patient's Medications  New Prescriptions   No medications on file  Previous Medications   ALBUTEROL-IPRATROPIUM (COMBIVENT) 18-103 MCG/ACT INHALER    Inhale 2 puffs into the lungs 3 (three) times daily.   ASPIRIN 81 MG TABLET    Take 1 tablet (81 mg total) by mouth daily.   BECLOMETHASONE (QVAR) 80 MCG/ACT INHALER    Inhale 1 puff into the lungs 2 (two) times daily.   CALCIUM CARBONATE-VITAMIN D (CALTRATE 600+D) 600-400 MG-UNIT PER TABLET    Take  1 tablet by mouth daily.   CHOLECALCIFEROL (VITAMIN D3) 1000 UNITS CAPS    Take 2 capsules (2,000 Units total) by mouth daily.   DONEPEZIL (ARICEPT) 10 MG TABLET    Take 1 tablet (10 mg total) by mouth at bedtime.   FLUOCINONIDE CREAM (LIDEX) 0.05 %    Apply topically 2 (two) times daily as needed.   FUROSEMIDE (LASIX) 20 MG TABLET    Take 40 mg by mouth daily.    GABAPENTIN (NEURONTIN) 300 MG CAPSULE    Take 1 capsule (300 mg total) by mouth at bedtime.   MECLIZINE (ANTIVERT) 25 MG TABLET    Take 1 tablet (25 mg total) by mouth at bedtime.   MEMANTINE HCL ER (NAMENDA XR) 28 MG CP24    Take 28 mg by mouth daily.   MIRTAZAPINE (REMERON) 30 MG TABLET    Take 45 mg by mouth at bedtime.   NITROFURANTOIN (MACRODANTIN) 50 MG CAPSULE    Take 1 capsule (50 mg total) by mouth at bedtime.   PAROXETINE (PAXIL) 20 MG TABLET    Take 1 tablet (20 mg total) by mouth every morning.   POTASSIUM CHLORIDE SA (K-DUR,KLOR-CON) 20 MEQ TABLET    Take 1 tablet (20 mEq total) by mouth daily.   TRAVOPROST, BENZALKONIUM, (TRAVATAN) 0.004 % OPHTHALMIC SOLUTION    Place 1 drop into both eyes daily.  Modified Medications   No medications on file  Discontinued Medications  No medications on file    Physical Exam: Filed Vitals:   08/31/13 1507  BP: 117/75  Pulse: 103  Temp: 97.5 F (36.4 C)  Resp: 16   Physical Exam  Constitutional: She appears well-developed and well-nourished. No distress.  HENT:  Head: Normocephalic and atraumatic.  Eyes: EOM are normal. Pupils are equal, round, and reactive to light.  Cardiovascular: Normal rate, regular rhythm, normal heart sounds and intact distal pulses.   Pulmonary/Chest: Effort normal. No respiratory distress.  Few coarse rhonchi right base  Abdominal: Soft. Bowel sounds are normal. She exhibits no distension. There is no tenderness.  Musculoskeletal:  Unable to stand for some reason, required full support of CNAs to stand   Neurological: She is alert. No cranial  nerve deficit. Coordination normal.  Very confused, just keeps repeating really bad, really bad, but then denies everything right now  Skin: Skin is warm and dry.   Assessment/Plan 1. Alzheimer's disease -had been stable  2. Acute delirium cbc, cmp stat Vitals q shift pCXR stat Flu swbs ua c+s due to back pain, weakness, delirium Push po fluids If all of these are unremarkable and mentation remains altered, would obtain CT brain due to possible unusual aphasia which is new  3. Unspecified asthma(493.90) Has been stable, few rhonchi present today No dyspnea, vitals stable, slight cough  Labs/tests ordered:  As above

## 2013-10-26 ENCOUNTER — Non-Acute Institutional Stay (SKILLED_NURSING_FACILITY): Payer: Medicare Other | Admitting: Internal Medicine

## 2013-10-26 ENCOUNTER — Encounter: Payer: Self-pay | Admitting: Internal Medicine

## 2013-10-26 DIAGNOSIS — F329 Major depressive disorder, single episode, unspecified: Secondary | ICD-10-CM

## 2013-10-26 DIAGNOSIS — N390 Urinary tract infection, site not specified: Secondary | ICD-10-CM

## 2013-10-26 DIAGNOSIS — R42 Dizziness and giddiness: Secondary | ICD-10-CM

## 2013-10-26 DIAGNOSIS — G609 Hereditary and idiopathic neuropathy, unspecified: Secondary | ICD-10-CM

## 2013-10-26 DIAGNOSIS — F3289 Other specified depressive episodes: Secondary | ICD-10-CM

## 2013-10-26 DIAGNOSIS — E785 Hyperlipidemia, unspecified: Secondary | ICD-10-CM

## 2013-10-26 DIAGNOSIS — F0390 Unspecified dementia without behavioral disturbance: Secondary | ICD-10-CM

## 2013-10-26 DIAGNOSIS — G629 Polyneuropathy, unspecified: Secondary | ICD-10-CM

## 2013-10-26 DIAGNOSIS — E1149 Type 2 diabetes mellitus with other diabetic neurological complication: Secondary | ICD-10-CM

## 2013-10-26 NOTE — Assessment & Plan Note (Signed)
Continue neurontin daily;can increase if needed

## 2013-10-26 NOTE — Assessment & Plan Note (Signed)
Pt had a UTI in December; is on macrobid for prophylaxis

## 2013-10-26 NOTE — Assessment & Plan Note (Signed)
Not on statin;last LDL 170 in 10/2012 with HDL 30; will start Crestor 20 mg daily and FLP in 2 months

## 2013-10-26 NOTE — Assessment & Plan Note (Signed)
Continue remeron and Paxil

## 2013-10-26 NOTE — Assessment & Plan Note (Signed)
On no meds;last A1c was 05/2013 was 7.1; will recheck

## 2013-10-26 NOTE — Progress Notes (Signed)
MRN: [id number] Name: TANJA GIFT  Sex: female Age: 80 y.o. DOB: 1933/04/13  Bee #: Karren Burly Facility/Room: 224B Level Of Care: SNF Provider: Inocencio Homes D Emergency Contacts: Extended Emergency Contact Information Primary Emergency Contact: [facility] Phone: [phone] Work Phone: [phone] Relation: Daughter  Code Status: DNR  Allergies: Codeine  Chief Complaint  Patient presents with  . Medical Managment of Chronic Issues    HPI: Patient is 80 y.o. female who is being seen for routine problems.  Past Medical History  Diagnosis Date  . ADENOCARCINOMA, BREAST   . DYSLIPIDEMIA   . DEMENTIA   . DEPRESSION   . ASTHMA   . GAIT DISTURBANCE   . Edema   . URINARY INCONTINENCE   . TRANSIENT ISCHEMIC ATTACK, HX OF   . DIVERTICULITIS, HX OF   . HYPERGLYCEMIA   . Venous stasis dermatitis     R>L LE, chronic  . 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 Breast surgery      05/31/04 2 lumps removed, s/p XRT and chemo  . Abdominal hysterectomy    . Breast biopsy  2005  . Urinary sling  2007  . Itp      1991 [facility] [place]      Medication List       This list is accurate as of: 10/26/13  2:56 PM.  Always use your most recent med list.               albuterol-ipratropium 18-103 MCG/ACT inhaler  Commonly known as:  COMBIVENT  Inhale 2 puffs into the lungs 3 (three) times daily.     aspirin 81 MG tablet  Take 1 tablet (81 mg total) by mouth daily.     beclomethasone 80 MCG/ACT inhaler  Commonly known as:  QVAR  Inhale 1 puff into the lungs 2 (two) times daily.     Calcium Carbonate-Vitamin D 600-400 MG-UNIT per tablet  Commonly known as:  CALTRATE 600+D  Take 1 tablet by mouth daily.     donepezil 10 MG tablet   Commonly known as:  ARICEPT  Take 1 tablet (10 mg total) by mouth at bedtime.     fluocinonide cream 0.05 %  Commonly known as:  LIDEX  Apply topically 2 (two) times daily as needed.     furosemide 20 MG tablet  Commonly known as:  LASIX  Take 40 mg by mouth daily.     gabapentin 300 MG capsule  Commonly known as:  NEURONTIN  Take 1 capsule (300 mg total) by mouth at bedtime.     meclizine 25 MG tablet  Commonly known as:  ANTIVERT  Take 1 tablet (25 mg total) by mouth at bedtime.     mirtazapine 30 MG tablet  Commonly known as:  REMERON  Take 45 mg by mouth at bedtime.     NAMENDA XR 28 MG Cp24  Generic drug:  Memantine HCl ER  Take 28 mg by mouth daily.     nitrofurantoin 50 MG capsule  Commonly known as:  MACRODANTIN  Take 1 capsule (50 mg total) by mouth at bedtime.     PARoxetine 20 MG tablet  Commonly known as:  PAXIL  Take 1 tablet (20 mg total) by mouth every morning.     potassium  chloride SA 20 MEQ tablet  Commonly known as:  K-DUR,KLOR-CON  Take 1 tablet (20 mEq total) by mouth daily.     travoprost (benzalkonium) 0.004 % ophthalmic solution  Commonly known as:  TRAVATAN  Place 1 drop into both eyes daily.     Vitamin D3 1000 UNITS Caps  Take 2 capsules (2,000 Units total) by mouth daily.        No orders of the defined types were placed in this encounter.    Immunization History  Administered Date(s) Administered  . Influenza Split 06/19/2011  . Influenza Whole 06/16/2009, 06/28/2013  . Pneumococcal Polysaccharide-23 09/17/2007  . Td 12/29/2008    History  Substance Use Topics  . Smoking status: Never Smoker   . Smokeless tobacco: Not on file     Comment: lives with spouse at [facility]  . Alcohol Use: No    Review of Systems  DATA OBTAINED: from patient;l NO C/O GENERAL: Feels well no fevers, fatigue, appetite changes SKIN: No itching, rash HEENT: No complaint RESPIRATORY: No cough, wheezing, SOB CARDIAC: No chest  pain, palpitations, lower extremity edema  GI: No abdominal pain, No N/V/D or constipation, No heartburn or reflux  GU: No dysuria, frequency or urgency, or incontinence  MUSCULOSKELETAL: No unrelieved bone/joint pain NEUROLOGIC: No headache, dizziness or focal weakness PSYCHIATRIC: No overt anxiety or sadness. Sleeps well.   Filed Vitals:   10/26/13 1438  BP: 105/48  Pulse: 74  Temp: 96.9 F (36.1 C)  Resp: 18    Physical Exam  GENERAL APPEARANCE: Alert, MOD conversant. Appropriately groomed. No acute distress  SKIN: No diaphoresis rash HEENT: Unremarkable RESPIRATORY: Breathing is even, unlabored. Lung sounds are clear   CARDIOVASCULAR: Heart RRR no murmurs, rubs or gallops. No peripheral edema  GASTROINTESTINAL: Abdomen is soft, non-tender, not distended w/ normal bowel sounds.  GENITOURINARY: Bladder non tender, not distended  MUSCULOSKELETAL: No abnormal joints or musculature NEUROLOGIC: Cranial nerves 2-12 grossly intact PSYCHIATRIC: , no behavioral issues  Patient Active Problem List   Diagnosis Date Noted  . Urinary tract infection, site not specified 02/26/2013  . Peripheral neuropathy 02/26/2013  . Vertigo 02/26/2013  . Venous stasis dermatitis   . Type II or unspecified type diabetes mellitus with neurological manifestations, uncontrolled(250.62)   . PSORIASIS, FEET 11/08/2010  . FATIGUE 11/08/2010  . WEIGHT GAIN 11/08/2010  . SWELLING OF LIMB 03/06/2010  . NONSPECIFIC ABNORMAL ELECTROCARDIOGRAM 03/06/2010  . GAIT DISTURBANCE 01/31/2010  . Dementia without behavioral disturbance 05/24/2009  . EDEMA 05/24/2009  . TRANSIENT ISCHEMIC ATTACK, HX OF 05/24/2009  . ADENOCARCINOMA, BREAST 12/29/2008  . DYSLIPIDEMIA 12/29/2008  . DEPRESSION 12/29/2008  . ASTHMA 12/29/2008  . URINARY INCONTINENCE 12/29/2008  . DIVERTICULITIS, HX OF 12/29/2008    CBC    Component Value Date/Time   WBC 8.3 06/22/2012 1402   WBC 7.5 01/31/2011 1409   RBC 4.90 06/22/2012 1402    RBC 4.46 01/31/2011 1409   HGB 14.9 06/22/2012 1402   HGB 13.7 01/31/2011 1409   HCT 45.8 06/22/2012 1402   HCT 40.9 01/31/2011 1409   PLT 237.0 06/22/2012 1402   PLT 221 01/31/2011 1409   MCV 93.4 06/22/2012 1402   MCV 91.7 01/31/2011 1409   LYMPHSABS 1.7 06/22/2012 1402   LYMPHSABS 1.5 01/31/2011 1409   MONOABS 0.6 06/22/2012 1402   MONOABS 0.7 01/31/2011 1409   EOSABS 0.2 06/22/2012 1402   EOSABS 0.2 01/31/2011 1409   BASOSABS 0.1 06/22/2012 1402   BASOSABS 0.0 01/31/2011 1409  CMP     Component Value Date/Time   NA 135 06/22/2012 1402   K 4.4 06/22/2012 1402   CL 96 06/22/2012 1402   CO2 30 06/22/2012 1402   GLUCOSE 129* 06/22/2012 1402   BUN 6 06/22/2012 1402   CREATININE 0.7 06/22/2012 1402   CALCIUM 9.5 06/22/2012 1402   PROT 7.4 11/15/2011 1351   ALBUMIN 3.4* 11/15/2011 1351   AST 44* 11/15/2011 1351   ALT 34 11/15/2011 1351   ALKPHOS 103 11/15/2011 1351   BILITOT 0.3 11/15/2011 1351   GFRNONAA 81* 11/15/2011 1351   GFRAA >90 11/15/2011 1351    Assessment and Plan  Type II or unspecified type diabetes mellitus with neurological manifestations, uncontrolled(250.62) On no meds;last A1c was 05/2013 was 7.1; will recheck  DYSLIPIDEMIA Not on statin;last LDL 170 in 10/2012 with HDL 30; will start Crestor 20 mg daily and FLP in 2 months  Urinary tract infection, site not specified Pt had a UTI in December; is on macrobid for prophylaxis  Dementia without behavioral disturbance Continue namenda and aricept  Peripheral neuropathy Continue neurontin daily;can increase if needed  Vertigo Pt get meclizine at [facility] ;appears stable  DEPRESSION Continue remeron and Paxil    Hennie Duos, MD

## 2013-10-26 NOTE — Assessment & Plan Note (Signed)
Pt get meclizine at [facility] ;appears stable

## 2013-10-26 NOTE — Assessment & Plan Note (Signed)
Continue namenda and aricept 

## 2013-12-21 LAB — LIPID PANEL
Cholesterol: 101 mg/dL (ref 0–200)
HDL: 35 mg/dL (ref 35–70)
LDL Cholesterol: 40 mg/dL
Triglycerides: 128 mg/dL (ref 40–160)

## 2014-01-05 ENCOUNTER — Encounter: Payer: Self-pay | Admitting: Internal Medicine

## 2014-01-05 ENCOUNTER — Non-Acute Institutional Stay (SKILLED_NURSING_FACILITY): Payer: Medicare Other | Admitting: Internal Medicine

## 2014-01-05 DIAGNOSIS — G629 Polyneuropathy, unspecified: Secondary | ICD-10-CM

## 2014-01-05 DIAGNOSIS — G609 Hereditary and idiopathic neuropathy, unspecified: Secondary | ICD-10-CM

## 2014-01-05 DIAGNOSIS — F329 Major depressive disorder, single episode, unspecified: Secondary | ICD-10-CM

## 2014-01-05 DIAGNOSIS — F0280 Dementia in other diseases classified elsewhere without behavioral disturbance: Secondary | ICD-10-CM

## 2014-01-05 DIAGNOSIS — G309 Alzheimer's disease, unspecified: Principal | ICD-10-CM

## 2014-01-05 DIAGNOSIS — R634 Abnormal weight loss: Secondary | ICD-10-CM

## 2014-01-05 DIAGNOSIS — E1149 Type 2 diabetes mellitus with other diabetic neurological complication: Secondary | ICD-10-CM

## 2014-01-05 DIAGNOSIS — H409 Unspecified glaucoma: Secondary | ICD-10-CM

## 2014-01-05 DIAGNOSIS — F32A Depression, unspecified: Secondary | ICD-10-CM

## 2014-01-05 DIAGNOSIS — F3289 Other specified depressive episodes: Secondary | ICD-10-CM

## 2014-01-05 NOTE — Progress Notes (Signed)
Patient ID: Tamara Perez, female   DOB: 1933-06-25, 80 y.o.   MRN: [id number]  Location:  [facility]  Provider:  Rexene Edison. Mariea Clonts, D.O., C.M.D.  Code Status: DNR   Chief Complaint  Patient presents with  . Medical Management of Chronic Issues    HPI:  80 yo white female with h/o Alzheimer's disease, COPD, depression, chronic venous stasis and DMII with neuropathy was seen for medical mgt of chronic diseases.  She has lost weight since 1 year ago.  Her husband passed away over the winter.    Review of Systems:  Review of Systems  Constitutional: Positive for weight loss. Negative for malaise/fatigue.  HENT: Negative for congestion.   Respiratory: Negative for shortness of breath.   Cardiovascular: Negative for chest pain.  Gastrointestinal: Negative for abdominal pain and constipation.  Musculoskeletal: Negative for falls.  Neurological: Positive for sensory change. Negative for weakness.  Endo/Heme/Allergies:       Diabetes  Psychiatric/Behavioral: Positive for depression and memory loss.    Medications: Patient's Medications  New Prescriptions   No medications on file  Previous Medications   ASPIRIN 81 MG TABLET    Take 1 tablet (81 mg total) by mouth daily.   BECLOMETHASONE (QVAR) 80 MCG/ACT INHALER    Inhale 2 puffs into the lungs 2 (two) times daily.    CALCIUM CARBONATE-VITAMIN D (CALTRATE 600+D) 600-400 MG-UNIT PER TABLET    Take 1 tablet by mouth daily.   CHOLECALCIFEROL (VITAMIN D3) 1000 UNITS CAPS    Take 2 capsules (2,000 Units total) by mouth daily.   DONEPEZIL (ARICEPT) 10 MG TABLET    Take 1 tablet (10 mg total) by mouth at bedtime.   FLUOCINONIDE CREAM (LIDEX) 0.05 %    Apply topically 2 (two) times daily as needed.   FUROSEMIDE (LASIX) 20 MG TABLET    Take 40 mg by mouth daily.    GABAPENTIN (NEURONTIN) 300 MG CAPSULE    Take 1 capsule (300 mg total) by mouth at bedtime.   MEMANTINE HCL ER (NAMENDA XR) 28 MG CP24    Take 28 mg by mouth  daily.   MIRTAZAPINE (REMERON) 30 MG TABLET    Take 45 mg by mouth at bedtime.   NITROFURANTOIN (MACRODANTIN) 50 MG CAPSULE    Take 1 capsule (50 mg total) by mouth at bedtime.   PAROXETINE (PAXIL) 20 MG TABLET    Take 1 tablet (20 mg total) by mouth every morning.   POTASSIUM CHLORIDE SA (K-DUR,KLOR-CON) 20 MEQ TABLET    Take 1 tablet (20 mEq total) by mouth daily.   TRAVOPROST, BENZALKONIUM, (TRAVATAN) 0.004 % OPHTHALMIC SOLUTION    Place 1 drop into both eyes daily. For glaucoma  Modified Medications   Modified Medication Previous Medication   MECLIZINE (ANTIVERT) 25 MG TABLET meclizine (ANTIVERT) 25 MG tablet      Take 12.5 mg by mouth at bedtime.    Take 1 tablet (25 mg total) by mouth at bedtime.  Discontinued Medications   ALBUTEROL-IPRATROPIUM (COMBIVENT) 18-103 MCG/ACT INHALER    Inhale 2 puffs into the lungs 3 (three) times daily.    Physical Exam: Filed Vitals:   01/05/14 1216  BP: 108/54  Pulse: 68  Temp: 98 F (36.7 C)  Resp: 18  Height: 5' (1.524 m)  Weight: 151 lb (68.493 kg)  SpO2: 98%  Physical Exam  Constitutional: No distress.  Cardiovascular: Normal rate, regular rhythm, normal heart sounds and intact distal pulses.   Pulmonary/Chest: Effort normal  and breath sounds normal. She has no wheezes.  Abdominal: Soft. Bowel sounds are normal. She exhibits no distension and no mass. There is no tenderness.  Musculoskeletal: Normal range of motion.  Neurological: She is alert.  Oriented to person only, dementia has progressed significantly over the past few months  Skin: Skin is warm.  Psychiatric: She has a normal mood and affect.   Labs reviewed: 10/27/13:  hba1c 6.6, tsh 4.11 12/29/13:  Tc 101, TG 128, LDL 40, HDL 35  Assessment/Plan 1. Alzheimer's disease -is progressing, cont namenda and aricept  2. Loss of weight Regular nas diet and 2 cal supplement 60cc tid--does not need concho with her weight loss  3. Peripheral neuropathy -due to diabetes--cont  gabapentin  4. Glaucoma -cont travatan  5. Depression -stable with remeron also to help appetite and sleep; chronic paxil  6. Diabetes mellitus with neurological manifestations, controlled -cont lasix, monitor hba1c, not on any other agents for this, not on statin or ace either (has not required bp medication)  Family/ staff Communication: discussed with nurse  Goals of care: long term care resident, DNR  Labs/tests ordered:  Cbc, cmp, hba1c in 1 month

## 2014-04-06 ENCOUNTER — Encounter: Payer: Self-pay | Admitting: Internal Medicine

## 2014-04-06 ENCOUNTER — Non-Acute Institutional Stay (SKILLED_NURSING_FACILITY): Payer: Medicare Other | Admitting: Internal Medicine

## 2014-04-06 DIAGNOSIS — F0280 Dementia in other diseases classified elsewhere without behavioral disturbance: Secondary | ICD-10-CM

## 2014-04-06 DIAGNOSIS — E785 Hyperlipidemia, unspecified: Secondary | ICD-10-CM

## 2014-04-06 DIAGNOSIS — I10 Essential (primary) hypertension: Secondary | ICD-10-CM

## 2014-04-06 DIAGNOSIS — H409 Unspecified glaucoma: Secondary | ICD-10-CM

## 2014-04-06 DIAGNOSIS — E1149 Type 2 diabetes mellitus with other diabetic neurological complication: Secondary | ICD-10-CM

## 2014-04-06 DIAGNOSIS — G309 Alzheimer's disease, unspecified: Principal | ICD-10-CM

## 2014-04-06 DIAGNOSIS — F3289 Other specified depressive episodes: Secondary | ICD-10-CM

## 2014-04-06 DIAGNOSIS — F32A Depression, unspecified: Secondary | ICD-10-CM

## 2014-04-06 DIAGNOSIS — F329 Major depressive disorder, single episode, unspecified: Secondary | ICD-10-CM

## 2014-04-06 NOTE — Progress Notes (Signed)
Patient ID: Scot Jun, female   DOB: [birth year], 80 y.o.   MRN: [id number]  Location: Location:  [facility] Provider:  Rexene Edison. Mariea Clonts, D.O., C.M.D.  Code Status:  DNR  Chief Complaint  Patient presents with  . Medical Management of Chronic Issues    HPI:  80 yo white female long term care resident seen for med mgt of chronic diseases including vascular dementia, gait disturbance, incontinence, TIA, DMII, hyperlipidemia.  She was doing well without complaints and staff had no new concerns about her.  Review of Systems:  Review of Systems  Constitutional: Negative for fever.  HENT: Negative for congestion.   Eyes: Negative for blurred vision.  Respiratory: Negative for shortness of breath.   Cardiovascular: Negative for chest pain and leg swelling.  Gastrointestinal: Negative for abdominal pain.  Genitourinary: Negative for dysuria.  Musculoskeletal: Negative for falls.  Skin: Negative for rash.  Neurological: Negative for dizziness and weakness.  Endo/Heme/Allergies: Bruises/bleeds easily.  Psychiatric/Behavioral: Positive for memory loss.    Medications: Patient's Medications  New Prescriptions   No medications on file  Previous Medications   ASPIRIN 81 MG TABLET    Take 1 tablet (81 mg total) by mouth daily.   BECLOMETHASONE (QVAR) 80 MCG/ACT INHALER    Inhale 2 puffs into the lungs 2 (two) times daily.    CALCIUM CARBONATE-VITAMIN D (CALTRATE 600+D) 600-400 MG-UNIT PER TABLET    Take 1 tablet by mouth daily.   CHOLECALCIFEROL (VITAMIN D3) 1000 UNITS CAPS    Take 2 capsules (2,000 Units total) by mouth daily.   DONEPEZIL (ARICEPT) 10 MG TABLET    Take 1 tablet (10 mg total) by mouth at bedtime.   FLUOCINONIDE CREAM (LIDEX) 0.05 %    Apply topically 2 (two) times daily as needed.   FUROSEMIDE (LASIX) 20 MG TABLET    Take 40 mg by mouth daily.    GABAPENTIN (NEURONTIN) 300 MG CAPSULE    Take 1 capsule (300 mg total) by mouth at bedtime.   MEMANTINE  HCL ER (NAMENDA XR) 28 MG CP24    Take 28 mg by mouth daily.   MIRTAZAPINE (REMERON) 30 MG TABLET    Take 45 mg by mouth at bedtime.   NITROFURANTOIN (MACRODANTIN) 50 MG CAPSULE    Take 1 capsule (50 mg total) by mouth at bedtime.   PAROXETINE (PAXIL) 20 MG TABLET    Take 1 tablet (20 mg total) by mouth every morning.   POTASSIUM CHLORIDE SA (K-DUR,KLOR-CON) 20 MEQ TABLET    Take 1 tablet (20 mEq total) by mouth daily.   TRAVOPROST, BENZALKONIUM, (TRAVATAN) 0.004 % OPHTHALMIC SOLUTION    Place 1 drop into both eyes daily. For glaucoma  Modified Medications   Modified Medication Previous Medication   MECLIZINE (ANTIVERT) 25 MG TABLET meclizine (ANTIVERT) 25 MG tablet      Take 12.5 mg by mouth at bedtime.    Take 1 tablet (25 mg total) by mouth at bedtime.  Discontinued Medications   ALBUTEROL-IPRATROPIUM (COMBIVENT) 18-103 MCG/ACT INHALER    Inhale 2 puffs into the lungs 3 (three) times daily.    Physical Exam: Filed Vitals:   04/06/14 0932  BP: 110/63  Pulse: 90  Temp: 97.4 F (36.3 C)  Resp: 16  Height: 5' (1.524 m)  Weight: 153 lb (69.4 kg)  SpO2: 97%  Physical Exam  Constitutional: She appears well-developed and well-nourished. No distress.  Cardiovascular: Normal rate, regular rhythm, normal heart sounds and intact distal pulses.  Pulmonary/Chest: Effort normal and breath sounds normal.  Abdominal: Soft. Bowel sounds are normal. She exhibits no distension and no mass. There is no tenderness.  Musculoskeletal: Normal range of motion.  Walks with walker  Neurological: She is alert.  Skin: Skin is warm and dry.   Labs reviewed: 10/27/13:  hba1c 6.6 4/15:  Normal lipids  Assessment/Plan 1. Alzheimer's disease -probably vascular dementia -cont aricept and namenda  2. Diabetes mellitus with neurological manifestations, controlled -f/u hba1c -cont gabapentin for neuropathic pain -cont diet, asa, has apparently been taken off her statin  3. Glaucoma -cont  travoprost -f/u with ophtho--needs visit  4. Essential hypertension, benign -bp at goal w/o meds recently  5. Hyperlipidemia -last lipids were wnl and statin got stopped   6. Depression -improved with paxil and remeron added  Family/ staff Communication: discussed with her nurse  Goals of care: DNR, long term care  Labs/tests ordered:  Cbc, bmp, hba1c next draw

## 2014-04-07 LAB — BASIC METABOLIC PANEL WITH GFR
BUN: 16 mg/dL (ref 4–21)
Creatinine: 0.6 mg/dL (ref 0.5–1.1)
Glucose: 144 mg/dL
Potassium: 5 mmol/L (ref 3.4–5.3)
Sodium: 145 mmol/L (ref 137–147)

## 2014-04-07 LAB — CBC AND DIFFERENTIAL
HEMATOCRIT: 45 % (ref 36–46)
Hemoglobin: 13.6 g/dL (ref 12.0–16.0)
PLATELETS: 238 10*3/uL (ref 150–399)
WBC: 9.4 10*3/mL

## 2014-04-07 LAB — HEMOGLOBIN A1C: Hgb A1c MFr Bld: 7 % — AB (ref 4.0–6.0)

## 2014-04-07 LAB — HEPATIC FUNCTION PANEL
ALT: 17 U/L (ref 7–35)
AST: 18 U/L (ref 13–35)

## 2014-07-06 ENCOUNTER — Non-Acute Institutional Stay (SKILLED_NURSING_FACILITY): Payer: Medicare Other | Admitting: Internal Medicine

## 2014-07-06 ENCOUNTER — Encounter: Payer: Self-pay | Admitting: Internal Medicine

## 2014-07-06 DIAGNOSIS — F32A Depression, unspecified: Secondary | ICD-10-CM

## 2014-07-06 DIAGNOSIS — E785 Hyperlipidemia, unspecified: Secondary | ICD-10-CM

## 2014-07-06 DIAGNOSIS — E1149 Type 2 diabetes mellitus with other diabetic neurological complication: Secondary | ICD-10-CM

## 2014-07-06 DIAGNOSIS — F0280 Dementia in other diseases classified elsewhere without behavioral disturbance: Secondary | ICD-10-CM

## 2014-07-06 DIAGNOSIS — F329 Major depressive disorder, single episode, unspecified: Secondary | ICD-10-CM

## 2014-07-06 DIAGNOSIS — G309 Alzheimer's disease, unspecified: Secondary | ICD-10-CM

## 2014-07-06 NOTE — Progress Notes (Signed)
Patient ID: Tamara Perez, female   DOB: 1933/07/02, 80 y.o.   MRN: [id number]   Place of Service: [facility]  Allergies  Allergen Reactions  . Codeine Other (See Comments)    Reaction unknown    Code Status: DNR  Goals of Care: Comfort and Quality of Life/Long term care  Chief Complaint  Patient presents with  . Medical Management of Chronic Issues    DM2, dementia, depression    HPI 80 y.o. female with PMH of DM2, dementia, depression and among others is being seen for a routine visit. No complaints verbalized by patient. No falls or skin issues reported. No change in appetite for bowel habit. No concerns from staff.   Review of Systems Constitutional: Negative for fever, chills, and fatigue. HENT: Negative for congestion and sore throat Eyes: Negative for eye pain and visual disturbance  Cardiovascular: Negative for chest pain, palpitations, and leg swelling Respiratory: Negative cough, shortness of breath, and wheezing.  Gastrointestinal: Negative for nausea and vomiting. Negative for abdominal pain, diarrhea and constipation.  Genitourinary: Negative for  dysuria, frequency, urgency, and hematuria Endocrine: Negative for polydipsia, polyphagia, and polyuria Musculoskeletal: Negative for back pain, joint pain, and joint swelling  Neurological: Negative for dizziness, headache Skin: Negative for rash and wound.   Psychiatric: Negative for nervous/anxious.  Past Medical History  Diagnosis Date  . ADENOCARCINOMA, BREAST   . DYSLIPIDEMIA   . DEMENTIA   . DEPRESSION   . ASTHMA   . GAIT DISTURBANCE   . Edema   . URINARY INCONTINENCE   . TRANSIENT ISCHEMIC ATTACK, HX OF   . DIVERTICULITIS, HX OF   . HYPERGLYCEMIA   . Venous stasis dermatitis     R>L LE, chronic  . Type II or unspecified type diabetes mellitus with neurological manifestations, uncontrolled     Qualifier: Diagnosis of  By: Asa Lente MD, Jannifer Rodney     Past Surgical History    Procedure Laterality Date  . Breast surgery      05/31/04 2 lumps removed, s/p XRT and chemo  . Abdominal hysterectomy    . Breast biopsy  2005  . Urinary sling  2007  . Itp      1991 [facility] [place]    History   Social History  . Marital Status: Married    Spouse Name: N/A    Number of Children: N/A  . Years of Education: N/A   Occupational History  . Not on file.   Social History Main Topics  . Smoking status: Never Smoker   . Smokeless tobacco: Not on file     Comment: lives with spouse at [facility]  . Alcohol Use: No  . Drug Use: No  . Sexual Activity: Not on file   Other Topics Concern  . Not on file   Social History Narrative  . No narrative on file      Medication List       This list is accurate as of: 07/06/14  3:59 PM.  Always use your most recent med list.               aspirin 81 MG tablet  Take 1 tablet (81 mg total) by mouth daily.     beclomethasone 80 MCG/ACT inhaler  Commonly known as:  QVAR  Inhale 2 puffs into the lungs 2 (two) times daily.     Calcium Carbonate-Vitamin D 600-400 MG-UNIT per tablet  Commonly known as:  CALTRATE 600+D  Take 1 tablet by mouth daily.     donepezil 10 MG tablet  Commonly known as:  ARICEPT  Take 1 tablet (10 mg total) by mouth at bedtime.     fluocinonide cream 0.05 %  Commonly known as:  LIDEX  Apply topically 2 (two) times daily as needed.     furosemide 20 MG tablet  Commonly known as:  LASIX  Take 40 mg by mouth daily.     gabapentin 300 MG capsule  Commonly known as:  NEURONTIN  Take 1 capsule (300 mg total) by mouth at bedtime.     meclizine 25 MG tablet  Commonly known as:  ANTIVERT  Take 12.5 mg by mouth at bedtime.     mirtazapine 30 MG tablet  Commonly known as:  REMERON  Take 45 mg by mouth at bedtime.     NAMENDA XR 28 MG Cp24  Generic drug:  Memantine HCl ER  Take 28 mg by mouth daily.     nitrofurantoin 50 MG capsule   Commonly known as:  MACRODANTIN  Take 1 capsule (50 mg total) by mouth at bedtime.     PARoxetine 20 MG tablet  Commonly known as:  PAXIL  Take 1 tablet (20 mg total) by mouth every morning.     potassium chloride SA 20 MEQ tablet  Commonly known as:  K-DUR,KLOR-CON  Take 1 tablet (20 mEq total) by mouth daily.     travoprost (benzalkonium) 0.004 % ophthalmic solution  Commonly known as:  TRAVATAN  Place 1 drop into both eyes daily. For glaucoma     Vitamin D3 1000 UNITS Caps  Take 2 capsules (2,000 Units total) by mouth daily.        Physical Exam Filed Vitals:   07/06/14 1534  BP: 110/60  Pulse: 70  Temp: 98 F (36.7 C)  Resp: 18   Constitutional: WDWN elderly female in no acute distress. Mumbling to self at times HEENT: Normocephalic and atraumatic. PERRL. No sinus tenderness. Oral mucosa moist. Posterior pharynx clear of any exudate or lesions.  Neck: Supple and nontender. No lymphadenopathy, masses, or thyromegaly. No JVD or carotid bruits. Cardiac: Normal S1, S2. RRR without appreciable murmurs, rubs, or gallops. Intact pulses intact. Trace dependent edema.  Lungs: No respiratory distress. Breath sounds clear bilaterally without rales, rhonchi, or wheezes. Abdomen: Audible bowel sounds in all quadrants. Soft, nontender, nondistended. No palpable mass.  Musculoskeletal: No joint erythema or tenderness. Seen in wheelchair Spine and Back:  No tenderness over spines. No CVA tenderness.  Skin: Warm and dry. No rash noted. No erythema.  Neurological: Alert and oriented to person.  Psychiatric: mumbling to self at times.  tapping left leg periodically.   Labs Reviewed CBC Latest Ref Rng 04/07/2014 06/22/2012 11/15/2011  WBC - 9.4 8.3 8.8  Hemoglobin 12.0 - 16.0 g/dL 13.6 14.9 10.3(L)  Hematocrit 36 - 46 % 45 45.8 43.6  Platelets 150 - 399 K/L 238 237.0 147(L)    CMP     Component Value Date/Time   NA 145 04/07/2014   NA 135 06/22/2012 1402   K 5.0 04/07/2014   CL 96  06/22/2012 1402   CO2 30 06/22/2012 1402   GLUCOSE 129* 06/22/2012 1402   BUN 16 04/07/2014   BUN 6 06/22/2012 1402   CREATININE 0.6 04/07/2014   CREATININE 0.7 06/22/2012 1402   CALCIUM 9.5 06/22/2012 1402   PROT 7.4 11/15/2011 1351   ALBUMIN 3.4* 11/15/2011 1351   AST 18 04/07/2014   ALT  17 04/07/2014   ALKPHOS 103 11/15/2011 1351   BILITOT 0.3 11/15/2011 1351   GFRNONAA 81* 11/15/2011 1351   GFRAA >90 11/15/2011 1351    Lipid Panel     Component Value Date/Time   CHOL 101 12/21/2013   TRIG 128 12/21/2013   HDL 35 12/21/2013   CHOLHDL 5 11/08/2010 1146   VLDL 20.4 11/08/2010 1146   LDLCALC 40 12/21/2013   04/07/14: a1c 7.0  Assessment & Plan 1. Depression Stable. Continue remeron 7.5mg  daily and paroxetine 20mg  daily. Continue to monitor for change in mood and behavior.   2. Alzheimer's disease Persists. Expect continual decline in cognitive function. Continue donepezil 10mg  daily and namenda xr 28mg  daily. Continue to monitor patient status. Continue fall risk and PU precautions.   3. Diabetes mellitus with neurological manifestations, controlled Current not on any antidiabetic agent. Last a1c 7.0 . Continue to monitor for now.   4. HLD  Last LDL 40 on 12/21/13. D/c lipitor 40mg  daily   Family/Staff Communication Plan of care discuss with resident and professional staff members. Resident and professional staff members verbalize understanding and agree with plan of care. No additional questions or concerns reported.    Arthur Holms, MSN, AGNP-C [facility] [address] [place], [place] [address] [phone] [8am-5pm] After hours: [phone]

## 2014-08-08 ENCOUNTER — Non-Acute Institutional Stay (SKILLED_NURSING_FACILITY): Payer: Medicare Other | Admitting: Nurse Practitioner

## 2014-08-08 DIAGNOSIS — N390 Urinary tract infection, site not specified: Secondary | ICD-10-CM

## 2014-08-08 DIAGNOSIS — R42 Dizziness and giddiness: Secondary | ICD-10-CM

## 2014-08-08 DIAGNOSIS — G629 Polyneuropathy, unspecified: Secondary | ICD-10-CM

## 2014-08-08 DIAGNOSIS — F0390 Unspecified dementia without behavioral disturbance: Secondary | ICD-10-CM

## 2014-08-08 DIAGNOSIS — E119 Type 2 diabetes mellitus without complications: Secondary | ICD-10-CM

## 2014-08-08 DIAGNOSIS — R634 Abnormal weight loss: Secondary | ICD-10-CM

## 2014-08-08 NOTE — Progress Notes (Signed)
Patient ID: Scot Jun, female   DOB: 27-Jul-1933, 80 y.o.   MRN: [id number]    Nursing Home Location:  [place] of Service: SNF (31)  PCP: Gwendolyn Grant, MD  Allergies  Allergen Reactions  . Codeine Other (See Comments)    Reaction unknown    Chief Complaint  Patient presents with  . Medical Management of Chronic Issues    HPI:  Patient is a 80 y.o. female seen today at [facility] for routine follow up on chronic conditions. Pt with h/o Alzheimer's disease, COPD, depression, chronic venous stasis and DMII with neuropathy. Pt on macrobid for chronic UTI. Pt has been with her usual state of health in the last month. Pt requiring assistance with ADLS. Pt is able to feed herself. Weight has been stable in the last few months. Pt is a poor historian due to advanced dementia. Staff without concerns at this time.  Review of Systems:  Review of Systems  Unable to perform ROS: Dementia    Past Medical History  Diagnosis Date  . ADENOCARCINOMA, BREAST   . DYSLIPIDEMIA   . DEMENTIA   . DEPRESSION   . ASTHMA   . GAIT DISTURBANCE   . Edema   . URINARY INCONTINENCE   . TRANSIENT ISCHEMIC ATTACK, HX OF   . DIVERTICULITIS, HX OF   . HYPERGLYCEMIA   . Venous stasis dermatitis     R>L LE, chronic  . Type II or unspecified type diabetes mellitus with neurological manifestations, uncontrolled     Qualifier: Diagnosis of  By: Asa Lente MD, Jannifer Rodney    Past Surgical History  Procedure Laterality Date  . Breast surgery      05/31/04 2 lumps removed, s/p XRT and chemo  . Abdominal hysterectomy    . Breast biopsy  2005  . Urinary sling  2007  . Itp      1991 [facility] [place]   Social History:   reports that she has never smoked. She does not have any smokeless tobacco history on file. She reports that she does not drink alcohol or use illicit drugs.  Family History  Problem Relation Age of  Onset  . Breast cancer Mother   . Kidney disease Mother   . Stroke Mother   . Prostate cancer Father   . Heart disease Father     Medications: Patient's Medications  New Prescriptions   No medications on file  Previous Medications   ASPIRIN 81 MG TABLET    Take 1 tablet (81 mg total) by mouth daily.   BECLOMETHASONE (QVAR) 80 MCG/ACT INHALER    Inhale 2 puffs into the lungs 2 (two) times daily.    CALCIUM CARBONATE-VITAMIN D (CALTRATE 600+D) 600-400 MG-UNIT PER TABLET    Take 1 tablet by mouth daily.   CHOLECALCIFEROL (VITAMIN D3) 1000 UNITS CAPS    Take 2 capsules (2,000 Units total) by mouth daily.   DONEPEZIL (ARICEPT) 10 MG TABLET    Take 1 tablet (10 mg total) by mouth at bedtime.   FLUOCINONIDE CREAM (LIDEX) 0.05 %    Apply topically 2 (two) times daily as needed.   FUROSEMIDE (LASIX) 20 MG TABLET    Take 40 mg by mouth daily.    GABAPENTIN (NEURONTIN) 300 MG CAPSULE    Take 1 capsule (300 mg total) by mouth at bedtime.   MECLIZINE (ANTIVERT) 25 MG TABLET    Take 12.5 mg by mouth at bedtime.  MEMANTINE HCL ER (NAMENDA XR) 28 MG CP24    Take 28 mg by mouth daily.   MIRTAZAPINE (REMERON) 30 MG TABLET    Take 45 mg by mouth at bedtime.   NITROFURANTOIN (MACRODANTIN) 50 MG CAPSULE    Take 1 capsule (50 mg total) by mouth at bedtime.   PAROXETINE (PAXIL) 20 MG TABLET    Take 1 tablet (20 mg total) by mouth every morning.   POTASSIUM CHLORIDE SA (K-DUR,KLOR-CON) 20 MEQ TABLET    Take 1 tablet (20 mEq total) by mouth daily.   TRAVOPROST, BENZALKONIUM, (TRAVATAN) 0.004 % OPHTHALMIC SOLUTION    Place 1 drop into both eyes daily. For glaucoma  Modified Medications   No medications on file  Discontinued Medications   No medications on file     Physical Exam: Filed Vitals:   08/08/14 1332  BP: 97/61  Pulse: 89  Temp: 98 F (36.7 C)  Resp: 20  Weight: 162 lb (73.483 kg)    Physical Exam  Constitutional: No distress.  HENT:  Mouth/Throat: Oropharynx is clear and moist. No  oropharyngeal exudate.  Neck: Normal range of motion. Neck supple.  Cardiovascular: Normal rate, regular rhythm, normal heart sounds and intact distal pulses.   Pulmonary/Chest: Effort normal and breath sounds normal. She has no wheezes.  Abdominal: Soft. Bowel sounds are normal. She exhibits no distension. There is no tenderness.  Musculoskeletal: Normal range of motion. She exhibits no edema or tenderness.  Neurological: She is alert.  Oriented to person only  Skin: Skin is warm. She is not diaphoretic.  Psychiatric: She has a normal mood and affect.    Labs reviewed: Basic Metabolic Panel:  Recent Labs  04/07/14  NA 145  K 5.0  BUN 16  CREATININE 0.6   Liver Function Tests:  Recent Labs  04/07/14  AST 18  ALT 17   No results for input(s): LIPASE, AMYLASE in the last 8760 hours. No results for input(s): AMMONIA in the last 8760 hours. CBC:  Recent Labs  04/07/14  WBC 9.4  HGB 13.6  HCT 45  PLT 238   TSH: No results for input(s): TSH in the last 8760 hours. A1C: Lab Results  Component Value Date   HGBA1C 7.0* 04/07/2014   Lipid Panel:  Recent Labs  12/21/13  CHOL 101  HDL 35  LDLCALC 40  TRIG 128     Assessment/Plan 1. Peripheral neuropathy Stable at this time. No complaints   2. Type 2 diabetes mellitus without complication V4M of 7 which is goal for her.   3. Dementia without behavioral disturbance Advanced in nature, no acute worsening of disease over the last month; conts on aricept and namenda    4. Chronic UTI -remains on macrobid, no current complaints   5. Vertigo No complaints of this, will try dose reduction and make meclizine PRN at this time  6. Loss of weight Remains on Remeron, weight has been stable

## 2014-08-24 ENCOUNTER — Encounter: Payer: Self-pay | Admitting: Internal Medicine

## 2014-08-24 ENCOUNTER — Non-Acute Institutional Stay (SKILLED_NURSING_FACILITY): Payer: Medicare Other | Admitting: Internal Medicine

## 2014-08-24 DIAGNOSIS — L309 Dermatitis, unspecified: Secondary | ICD-10-CM

## 2014-08-24 DIAGNOSIS — H8110 Benign paroxysmal vertigo, unspecified ear: Secondary | ICD-10-CM

## 2014-08-24 NOTE — Progress Notes (Signed)
Patient ID: Tamara Perez, female   DOB: June 27, 1933, 80 y.o.   MRN: [id number]  Location:  [facility] Provider:  Rexene Edison. Mariea Clonts, D.O., C.M.D.  Code Status:  DNR  Chief Complaint  Patient presents with  . Acute Visit    left cheek rash, her daughter is concerned it is fluid-filled and could be cancer    HPI:  Was prescribed triamcinolone cream for eczema yesterday for this by NP.  Review of Systems:  Review of Systems  Constitutional: Negative for fever and chills.  HENT: Negative for congestion.   Eyes: Negative for blurred vision.  Respiratory: Negative for shortness of breath.   Cardiovascular: Negative for chest pain.  Gastrointestinal: Negative for abdominal pain, constipation, blood in stool and melena.  Genitourinary: Negative for dysuria.  Musculoskeletal: Negative for falls.       Unsteady gait; uses walker, but usually in wheelchair now  Skin: Positive for itching and rash.  Neurological: Positive for dizziness. Negative for loss of consciousness.  Endo/Heme/Allergies: Does not bruise/bleed easily.  Psychiatric/Behavioral: Positive for depression and memory loss.       Mood good lately per staff    Medications: Patient's Medications  New Prescriptions   No medications on file  Previous Medications   ASPIRIN 81 MG TABLET    Take 1 tablet (81 mg total) by mouth daily.   BECLOMETHASONE (QVAR) 80 MCG/ACT INHALER    Inhale 2 puffs into the lungs 2 (two) times daily.    CALCIUM CARBONATE-VITAMIN D (CALTRATE 600+D) 600-400 MG-UNIT PER TABLET    Take 1 tablet by mouth daily.   CHOLECALCIFEROL (VITAMIN D3) 1000 UNITS CAPS    Take 2 capsules (2,000 Units total) by mouth daily.   DONEPEZIL (ARICEPT) 10 MG TABLET    Take 1 tablet (10 mg total) by mouth at bedtime.   FLUOCINONIDE CREAM (LIDEX) 0.05 %    Apply topically 2 (two) times daily as needed.   FUROSEMIDE (LASIX) 20 MG TABLET    Take 40 mg by mouth daily.    GABAPENTIN (NEURONTIN) 300 MG CAPSULE     Take 1 capsule (300 mg total) by mouth at bedtime.   MECLIZINE (ANTIVERT) 25 MG TABLET    Take 12.5 mg by mouth at bedtime as needed.   MEMANTINE HCL ER (NAMENDA XR) 28 MG CP24    Take 28 mg by mouth daily.   MIRTAZAPINE (REMERON) 30 MG TABLET    Take 45 mg by mouth at bedtime.   NITROFURANTOIN (MACRODANTIN) 50 MG CAPSULE    Take 1 capsule (50 mg total) by mouth at bedtime.   PAROXETINE (PAXIL) 20 MG TABLET    Take 1 tablet (20 mg total) by mouth every morning.   POTASSIUM CHLORIDE SA (K-DUR,KLOR-CON) 20 MEQ TABLET    Take 1 tablet (20 mEq total) by mouth daily.   TRAVOPROST, BENZALKONIUM, (TRAVATAN) 0.004 % OPHTHALMIC SOLUTION    Place 1 drop into both eyes daily. For glaucoma  Modified Medications   No medications on file  Discontinued Medications   No medications on file    Physical Exam: Filed Vitals:   08/24/14 1540  BP: 133/83  Pulse: 75  Temp: 98.3 F (36.8 C)  Resp: 18  Height: 5' (1.524 m)  Weight: 162 lb (73.483 kg)  Physical Exam  Cardiovascular: Normal rate, regular rhythm and normal heart sounds.   Pulmonary/Chest: Effort normal and breath sounds normal.  Abdominal: Soft. Bowel sounds are normal.  Musculoskeletal: Normal range of motion.  Neurological: She is alert.  Skin:  Left cheek with erythematous dry patch, no vesicles seen  Psychiatric: She has a normal mood and affect.    Labs reviewed: Basic Metabolic Panel:  Recent Labs  04/07/14  NA 145  K 5.0  BUN 16  CREATININE 0.6    Liver Function Tests:  Recent Labs  04/07/14  AST 18  ALT 17    CBC:  Recent Labs  04/07/14  WBC 9.4  HGB 13.6  HCT 45  PLT 238    Assessment/Plan 1. Eczema of face -cont triamcinolone that was ordered and monitor -if does not improve with this or a straightup steroid cream, would then refer to derm, but appears benign to me  2. BPPV (benign paroxysmal positional vertigo), unspecified laterality -referred to PT for Epley maneuver to help with  vertigo   Family/ staff Communication: nurse informed to give daughter message   Goals of care: long term care

## 2014-09-20 ENCOUNTER — Encounter: Payer: Self-pay | Admitting: Adult Health

## 2014-09-20 ENCOUNTER — Non-Acute Institutional Stay (SKILLED_NURSING_FACILITY): Payer: Medicare Other | Admitting: Adult Health

## 2014-09-20 DIAGNOSIS — F329 Major depressive disorder, single episode, unspecified: Secondary | ICD-10-CM

## 2014-09-20 DIAGNOSIS — J45909 Unspecified asthma, uncomplicated: Secondary | ICD-10-CM

## 2014-09-20 DIAGNOSIS — F32A Depression, unspecified: Secondary | ICD-10-CM

## 2014-09-20 DIAGNOSIS — F0390 Unspecified dementia without behavioral disturbance: Secondary | ICD-10-CM

## 2014-09-20 DIAGNOSIS — N390 Urinary tract infection, site not specified: Secondary | ICD-10-CM

## 2014-09-20 DIAGNOSIS — G629 Polyneuropathy, unspecified: Secondary | ICD-10-CM

## 2014-09-20 DIAGNOSIS — R609 Edema, unspecified: Secondary | ICD-10-CM

## 2014-09-20 NOTE — Progress Notes (Signed)
Patient ID: Tamara Perez, female   DOB: [birth year], 81 y.o.   MRN: [id number]  [facility]     Allergies  Allergen Reactions  . Codeine Other (See Comments)    Reaction unknown       Chief Complaint  Patient presents with  . Medical Management of Chronic Issues    HPI:  She is a long term resident of this facility of this facility being seen for the management of her chronic illnesses. Overall her status is stable without significant change. She is not voicing any complaints or concerns today. The nursing staff is not voicing any concerns.    Past Medical History  Diagnosis Date  . ADENOCARCINOMA, BREAST   . DYSLIPIDEMIA   . DEMENTIA   . DEPRESSION   . ASTHMA   . GAIT DISTURBANCE   . Edema   . URINARY INCONTINENCE   . TRANSIENT ISCHEMIC ATTACK, HX OF   . DIVERTICULITIS, HX OF   . HYPERGLYCEMIA   . Venous stasis dermatitis     R>L LE, chronic  . 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 Breast surgery      05/31/04 2 lumps removed, s/p XRT and chemo  . Abdominal hysterectomy    . Breast biopsy  2005  . Urinary sling  2007  . Itp      1991 [facility] [place]    VITAL SIGNS BP 130/80 mmHg  Pulse 88  Ht 5' (1.524 m)  Wt 166 lb (75.297 kg)  BMI 32.42 kg/m2   Outpatient Encounter Prescriptions as of 09/20/2014  Medication Sig  . aspirin 81 MG tablet Take 1 tablet (81 mg total) by mouth daily.  . beclomethasone (QVAR) 80 MCG/ACT inhaler Inhale 2 puffs into the lungs 2 (two) times daily.   . Calcium Carbonate-Vitamin D (CALTRATE 600+D) 600-400 MG-UNIT per tablet Take 1 tablet by mouth daily.  . Cholecalciferol (VITAMIN D3) 1000 UNITS CAPS Take 2 capsules (2,000 Units total) by mouth daily.  Marland Kitchen donepezil (ARICEPT) 10 MG tablet Take 1 tablet (10 mg total) by mouth at  bedtime.  . fluocinonide cream (LIDEX) 0.05 % Apply topically 2 (two) times daily as needed.  . furosemide (LASIX) 20 MG tablet Take 20 mg by mouth daily.   Marland Kitchen gabapentin (NEURONTIN) 300 MG capsule Take 1 capsule (300 mg total) by mouth at bedtime.  . meclizine (ANTIVERT) 25 MG tablet Take 12.5 mg by mouth at bedtime as needed.  . Memantine HCl ER (NAMENDA XR) 28 MG CP24 Take 28 mg by mouth daily.  . mirtazapine (REMERON) 30 MG tablet Take 7.5 mg by mouth at bedtime.   . nitrofurantoin (MACRODANTIN) 50 MG capsule Take 1 capsule (50 mg total) by mouth at bedtime.  Marland Kitchen PARoxetine (PAXIL) 20 MG tablet Take 1 tablet (20 mg total) by mouth every morning.  . travoprost, benzalkonium, (TRAVATAN) 0.004 % ophthalmic solution Place 1 drop into both eyes daily. For glaucoma     SIGNIFICANT DIAGNOSTIC EXAMS   LABS REVIEWED:   12-21-13: chol 101; ldl 40; trig 128 04-04-14: liver normal albumin 3.8 04-07-14: wbc 9.4; hgb 13.6; hct 45.3; mcv 92.4; plt 238; glucose 144; bun 16.3; creat 0.62; k+5.0; na++145; hgb a1c 7.0     Review of Systems  Constitutional: Negative for malaise/fatigue.  Respiratory: Negative for cough and shortness  of breath.   Cardiovascular: Negative for chest pain, palpitations and leg swelling.  Gastrointestinal: Negative for heartburn, abdominal pain and constipation.  Musculoskeletal: Negative for myalgias and joint pain.  Skin: Negative.   Psychiatric/Behavioral: The patient is not nervous/anxious.      Physical Exam  Constitutional: She appears well-developed and well-nourished. No distress.  Overweight   Neck: Neck supple. No JVD present. No thyromegaly present.  Cardiovascular: Normal rate, regular rhythm and intact distal pulses.   Respiratory: Effort normal and breath sounds normal. No respiratory distress.  GI: Soft. Bowel sounds are normal. She exhibits no distension. There is no tenderness.  Musculoskeletal: She exhibits no edema.  Is able to move all extremities    Neurological: She is alert.  Skin: Skin is warm. She is not diaphoretic.       ASSESSMENT/ PLAN:  1. Dementia: no significant change; will continue aricept 10 mg daily; and namenda xr 28 mg daily; asa 81 mg daily will monitor   2. Edema: is stable will continue lasix 20 mg daily   3. Asthma: is stable will continue QVar 2 puffs twice daily will monitor   4. Peripheral neuropathy: is stable will continue neurontin 300 mg nightly will monitor  5. UTI: no recent infection present; is on long term therapy macrobid 50 mg nightly and will monitor   6. Depression: is stable will continue paxil 20 mg daily; is on remeron 7.5 mg nightly; she has gained four pounds since November 2015 from 162 pounds to 166 pounds. Will not make changes will monitor   7. Glaucoma: will continue travatan to both eyes daily   Will check cbc; cmp     Ok Edwards NP [facility]  Contact [phone] Monday through Friday 8am- 5pm  After hours call [phone]

## 2014-10-23 ENCOUNTER — Encounter: Payer: Self-pay | Admitting: Internal Medicine

## 2014-10-23 ENCOUNTER — Non-Acute Institutional Stay (SKILLED_NURSING_FACILITY): Payer: Medicare Other | Admitting: Internal Medicine

## 2014-10-23 DIAGNOSIS — E785 Hyperlipidemia, unspecified: Secondary | ICD-10-CM | POA: Insufficient documentation

## 2014-10-23 DIAGNOSIS — F0280 Dementia in other diseases classified elsewhere without behavioral disturbance: Secondary | ICD-10-CM | POA: Insufficient documentation

## 2014-10-23 DIAGNOSIS — G629 Polyneuropathy, unspecified: Secondary | ICD-10-CM

## 2014-10-23 DIAGNOSIS — H409 Unspecified glaucoma: Secondary | ICD-10-CM | POA: Insufficient documentation

## 2014-10-23 DIAGNOSIS — G309 Alzheimer's disease, unspecified: Secondary | ICD-10-CM

## 2014-10-23 DIAGNOSIS — I10 Essential (primary) hypertension: Secondary | ICD-10-CM

## 2014-10-23 DIAGNOSIS — F32A Depression, unspecified: Secondary | ICD-10-CM

## 2014-10-23 DIAGNOSIS — J45909 Unspecified asthma, uncomplicated: Secondary | ICD-10-CM

## 2014-10-23 DIAGNOSIS — F329 Major depressive disorder, single episode, unspecified: Secondary | ICD-10-CM

## 2014-10-23 NOTE — Progress Notes (Signed)
Patient ID: Tamara Perez, female   DOB: [birth year], 81 y.o.   MRN: [id number]    Facility  [facility]     Place of Service:   SNF   Allergies  Allergen Reactions  . Codeine Other (See Comments)    Reaction unknown    Chief Complaint  Patient presents with  . Medical Management of Chronic Issues    dementia, edema, asthma, peripheral neuropathy, depression, recurrent UTI, glaucoma, hx TIA, hyperlipidemia, hx breast CA    HPI:  81 yo female long term resident seen today for above. She has no concerns. No nursing issues. No further HPI information obtained due to dementia. Last eye exam in 10/15 and revealed no retinal changes. She had her flu shot in 10/15.  CODE STATUS:  DNR  Medications: Patient's Medications  New Prescriptions   No medications on file  Previous Medications   ASPIRIN 81 MG TABLET    Take 1 tablet (81 mg total) by mouth daily.   BECLOMETHASONE (QVAR) 80 MCG/ACT INHALER    Inhale 2 puffs into the lungs 2 (two) times daily.    CALCIUM CARBONATE-VITAMIN D (CALTRATE 600+D) 600-400 MG-UNIT PER TABLET    Take 1 tablet by mouth daily.   CHOLECALCIFEROL (VITAMIN D3) 1000 UNITS CAPS    Take 2 capsules (2,000 Units total) by mouth daily.   DONEPEZIL (ARICEPT) 10 MG TABLET    Take 1 tablet (10 mg total) by mouth at bedtime.   FLUOCINONIDE CREAM (LIDEX) 0.05 %    Apply topically 2 (two) times daily as needed.   FUROSEMIDE (LASIX) 20 MG TABLET    Take 20 mg by mouth daily.    GABAPENTIN (NEURONTIN) 300 MG CAPSULE    Take 1 capsule (300 mg total) by mouth at bedtime.   MAGNESIUM HYDROXIDE (MILK OF MAGNESIA) 400 MG/5ML SUSPENSION    Take 30 mLs by mouth daily as needed for mild constipation.   MECLIZINE (ANTIVERT) 25 MG TABLET    Take 12.5 mg by mouth at bedtime as needed.   MEMANTINE HCL ER (NAMENDA XR) 28 MG CP24    Take 28 mg by mouth daily.   MIRTAZAPINE (REMERON) 30 MG TABLET    Take 7.5 mg by mouth at bedtime.    NITROFURANTOIN (MACRODANTIN) 50 MG  CAPSULE    Take 1 capsule (50 mg total) by mouth at bedtime.   PAROXETINE (PAXIL) 20 MG TABLET    Take 1 tablet (20 mg total) by mouth every morning.   POTASSIUM CHLORIDE SA (K-DUR,KLOR-CON) 20 MEQ TABLET    Take 1 tablet (20 mEq total) by mouth daily.   TRAVOPROST, BENZALKONIUM, (TRAVATAN) 0.004 % OPHTHALMIC SOLUTION    Place 1 drop into both eyes daily. For glaucoma  Modified Medications   No medications on file  Discontinued Medications   No medications on file     Review of Systems  Unable to obtain due to mental status.  Filed Vitals:   10/20/14 0218  BP: 118/58  Pulse: 90  Temp: 98.1 F (36.7 C)  Weight: 166 lb (75.297 kg)  SpO2: 96%   Body mass index is 32.42 kg/(m^2).  Physical Exam CONSTITUTIONAL: Looks frail in NAD. Awake and alert HEENT: PERRLA. No scleral icterus.  Oropharynx clear and without exudate. MMM NECK: Supple. Nontender. No palpable cervical or supraclavicular lymph nodes. No carotid bruit b/l.  CVS: Regular rate without murmur, gallop or rub. LUNGS: CTA b/l no wheezing, rales or rhonchi. ABDOMEN: Bowel sounds present x 4. Soft,  nontender, nondistended. No palpable mass or bruit EXTREMITIES: No edema b/l. Distal pulses palpable. No calf tenderness PSYCH: Affect, behavior and mood normal   Labs reviewed: No visits with results within 3 Month(s) from this visit. Latest known visit with results is:  Nursing Home on 07/06/2014  Component Date Value Ref Range Status  . Hemoglobin 04/07/2014 13.6  12.0 - 16.0 g/dL Final  . HCT 04/07/2014 45  36 - 46 % Final  . Platelets 04/07/2014 238  150 - 399 K/L Final  . WBC 04/07/2014 9.4   Final  . Glucose 04/07/2014 144   Final  . BUN 04/07/2014 16  4 - 21 mg/dL Final  . Creatinine 04/07/2014 0.6  0.5 - 1.1 mg/dL Final  . Potassium 04/07/2014 5.0  3.4 - 5.3 mmol/L Final  . Sodium 04/07/2014 145  137 - 147 mmol/L Final  . ALT 04/07/2014 17  7 - 35 U/L Final  . AST 04/07/2014 18  13 - 35 U/L Final  . Hgb A1c  MFr Bld 04/07/2014 7.0* 4.0 - 6.0 % Final   Chart reviewed  Assessment/Plan   ICD-9-CM ICD-10-CM   1. Alzheimer's disease - stable 331.0 G30.9   2. Essential hypertension, benign - controlled 401.1 I10   3. Depression - stable 311 F32.9   4. Asthma, unspecified asthma severity, uncomplicated- stable 030.13 J45.909   5. Peripheral neuropathy 356.9 G62.9   6. Hyperlipidemia 272.4 E78.5   7. Glaucoma - off eye gtts 365.9 H40.9     --check fasting labs- CBC w diff, CMP and lipid panel  --continue current medications as ordered  --nutritional supplement as ordered  --Pt is medically stable on current tx plan. Will follow   Ajia Chadderdon S. Perlie Gold  [facility] [address] [place], [place] [address] [phone] Office (Wednesdays and Fridays 8 AM - 5 PM) [phone] Cell (Monday-Friday 8 AM - 5 PM)

## 2014-12-01 ENCOUNTER — Non-Acute Institutional Stay (SKILLED_NURSING_FACILITY): Payer: Medicare Other | Admitting: Adult Health

## 2014-12-01 DIAGNOSIS — G629 Polyneuropathy, unspecified: Secondary | ICD-10-CM | POA: Diagnosis not present

## 2014-12-01 DIAGNOSIS — R609 Edema, unspecified: Secondary | ICD-10-CM

## 2014-12-01 DIAGNOSIS — H409 Unspecified glaucoma: Secondary | ICD-10-CM | POA: Diagnosis not present

## 2014-12-01 DIAGNOSIS — G309 Alzheimer's disease, unspecified: Secondary | ICD-10-CM | POA: Diagnosis not present

## 2014-12-01 DIAGNOSIS — F329 Major depressive disorder, single episode, unspecified: Secondary | ICD-10-CM

## 2014-12-01 DIAGNOSIS — E119 Type 2 diabetes mellitus without complications: Secondary | ICD-10-CM | POA: Diagnosis not present

## 2014-12-01 DIAGNOSIS — J45909 Unspecified asthma, uncomplicated: Secondary | ICD-10-CM

## 2014-12-01 DIAGNOSIS — F0280 Dementia in other diseases classified elsewhere without behavioral disturbance: Secondary | ICD-10-CM

## 2014-12-01 DIAGNOSIS — F32A Depression, unspecified: Secondary | ICD-10-CM

## 2014-12-01 DIAGNOSIS — N390 Urinary tract infection, site not specified: Secondary | ICD-10-CM

## 2014-12-31 ENCOUNTER — Encounter: Payer: Self-pay | Admitting: Adult Health

## 2014-12-31 NOTE — Progress Notes (Signed)
Patient ID: Scot Jun, female   DOB: [birth year], 81 y.o.   MRN: [id number]  [facility]     Allergies  Allergen Reactions  . Codeine Other (See Comments)    Reaction unknown       Chief Complaint  Patient presents with  . Annual Exam    HPI:  She is a long term resident of this facility being seen for her annual review. She has not been hospitalized over the past year. She has gained weight over the recent months; is presently weighing 167 pounds. She is unable to fully participate in the hpi or ros. There are no nursing concerns at this time.    Past Medical History  Diagnosis Date  . ADENOCARCINOMA, BREAST   . DYSLIPIDEMIA   . DEMENTIA   . DEPRESSION   . ASTHMA   . GAIT DISTURBANCE   . Edema   . URINARY INCONTINENCE   . TRANSIENT ISCHEMIC ATTACK, HX OF   . DIVERTICULITIS, HX OF   . HYPERGLYCEMIA   . Venous stasis dermatitis     R>L LE, chronic  . 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 Breast surgery      05/31/04 2 lumps removed, s/p XRT and chemo  . Abdominal hysterectomy    . Breast biopsy  2005  . Urinary sling  2007  . Itp      1991 [facility] [place]    VITAL SIGNS BP 132/72 mmHg  Pulse 80  Ht 4\' 11"  (1.499 m)  Wt 167 lb (75.751 kg)  BMI 33.71 kg/m2   Outpatient Encounter Prescriptions as of 12/01/2014  Medication Sig  . aspirin 81 MG tablet Take 1 tablet (81 mg total) by mouth daily.  . beclomethasone (QVAR) 80 MCG/ACT inhaler Inhale 2 puffs into the lungs 2 (two) times daily.   . Calcium Carbonate-Vitamin D (CALTRATE 600+D) 600-400 MG-UNIT per tablet Take 1 tablet by mouth daily.  . Cholecalciferol (VITAMIN D3) 1000 UNITS CAPS Take 2 capsules (2,000 Units total) by mouth daily.  Marland Kitchen donepezil (ARICEPT) 10 MG tablet Take 1 tablet (10 mg total) by  mouth at bedtime.  . fluocinonide cream (LIDEX) 0.05 % Apply topically 2 (two) times daily as needed.  . furosemide (LASIX) 20 MG tablet Take 20 mg by mouth daily.   Marland Kitchen gabapentin (NEURONTIN) 300 MG capsule Take 1 capsule (300 mg total) by mouth at bedtime.  . magnesium hydroxide (MILK OF MAGNESIA) 400 MG/5ML suspension Take 30 mLs by mouth daily as needed for mild constipation.  . meclizine (ANTIVERT) 25 MG tablet Take 12.5 mg by mouth at bedtime as needed.  . Memantine HCl ER (NAMENDA XR) 28 MG CP24 Take 28 mg by mouth daily.  . mirtazapine (REMERON) 30 MG tablet Take 7.5 mg by mouth at bedtime.   . nitrofurantoin (MACRODANTIN) 50 MG capsule Take 1 capsule (50 mg total) by mouth at bedtime.  Marland Kitchen PARoxetine (PAXIL) 20 MG tablet Take 1 tablet (20 mg total) by mouth every morning.  . potassium chloride SA (K-DUR,KLOR-CON) 20 MEQ tablet Take 1 tablet (20 mEq total) by mouth daily.  . travoprost, benzalkonium, (TRAVATAN) 0.004 % ophthalmic solution Place 1 drop into both eyes daily. For glaucoma     SIGNIFICANT DIAGNOSTIC EXAMS  NOT APPROPRIATE FOR DEXA; MAMMOGRAM; COLONOSCOPY     LABS  REVIEWED:   12-21-13: chol 101; ldl 40; trig 128 04-04-14: liver normal albumin 3.8 04-07-14: wbc 9.4; hgb 13.6; hct 45.3; mcv 92.4; plt 238; glucose 144; bun 16.3; creat 0.62; k+5.0; na++145; hgb a1c 7.0      Review of Systems  Unable to perform ROS    Physical Exam Constitutional: She appears well-developed and well-nourished. No distress.  Overweight   Neck: Neck supple. No JVD present. No thyromegaly present.  Cardiovascular: Normal rate, regular rhythm and intact distal pulses.   Respiratory: Effort normal and breath sounds normal. No respiratory distress.  GI: Soft. Bowel sounds are normal. She exhibits no distension. There is no tenderness.  Musculoskeletal: She exhibits no edema.  Is able to move all extremities   Neurological: She is alert.  Skin: Skin is warm. She is not diaphoretic.        ASSESSMENT/ PLAN:   1. Dementia: no significant change; will continue aricept 10 mg daily; and namenda xr 28 mg daily; asa 81 mg daily will monitor   2. Edema: is stable will continue lasix 20 mg daily with k+ 20 meq daily   3. Asthma: is stable will continue QVar 2 puffs twice daily will monitor   4. Peripheral neuropathy: is stable will continue neurontin 300 mg nightly will monitor  5. UTI: no recent infection present; is on long term therapy macrobid 50 mg nightly and will monitor   6. Depression: is stable will continue paxil 20 mg daily; is on remeron 7.5 mg nightly; she has gained four pounds since November 2015 from 162 pounds to 167 pounds. Will not make changes will monitor   7. Glaucoma: will continue travatan to both eyes daily   8. Diabetes: is presently not on medications; will not make changes will monitor her status.    Ok Edwards NP [facility]  Contact [phone] Monday through Friday 8am- 5pm  After hours call [phone]

## 2015-01-04 ENCOUNTER — Encounter: Payer: Self-pay | Admitting: Adult Health

## 2015-01-04 ENCOUNTER — Non-Acute Institutional Stay (SKILLED_NURSING_FACILITY): Payer: Medicare Other | Admitting: Adult Health

## 2015-01-04 DIAGNOSIS — G309 Alzheimer's disease, unspecified: Secondary | ICD-10-CM | POA: Diagnosis not present

## 2015-01-04 DIAGNOSIS — G629 Polyneuropathy, unspecified: Secondary | ICD-10-CM

## 2015-01-04 DIAGNOSIS — F329 Major depressive disorder, single episode, unspecified: Secondary | ICD-10-CM

## 2015-01-04 DIAGNOSIS — N390 Urinary tract infection, site not specified: Secondary | ICD-10-CM

## 2015-01-04 DIAGNOSIS — R609 Edema, unspecified: Secondary | ICD-10-CM

## 2015-01-04 DIAGNOSIS — E119 Type 2 diabetes mellitus without complications: Secondary | ICD-10-CM | POA: Diagnosis not present

## 2015-01-04 DIAGNOSIS — F32A Depression, unspecified: Secondary | ICD-10-CM

## 2015-01-04 DIAGNOSIS — F0280 Dementia in other diseases classified elsewhere without behavioral disturbance: Secondary | ICD-10-CM

## 2015-01-04 DIAGNOSIS — J45909 Unspecified asthma, uncomplicated: Secondary | ICD-10-CM

## 2015-01-04 DIAGNOSIS — H409 Unspecified glaucoma: Secondary | ICD-10-CM | POA: Diagnosis not present

## 2015-01-04 NOTE — Progress Notes (Signed)
Patient ID: Scot Jun, female   DOB: 1933-08-08, 81 y.o.   MRN: [id number]  [facility]     Allergies  Allergen Reactions  . Codeine Other (See Comments)    Reaction unknown       Chief Complaint  Patient presents with  . Medical Management of Chronic Issues    HPI:  She is a long term resident of this facility being seen for the management of her chronic illnesses. Overall her status remains without change. She is unable to fully participate in the hpi or ros. There are no nursing concerns at this time.    Past Medical History  Diagnosis Date  . ADENOCARCINOMA, BREAST   . DYSLIPIDEMIA   . DEMENTIA   . DEPRESSION   . ASTHMA   . GAIT DISTURBANCE   . Edema   . URINARY INCONTINENCE   . TRANSIENT ISCHEMIC ATTACK, HX OF   . DIVERTICULITIS, HX OF   . HYPERGLYCEMIA   . Venous stasis dermatitis     R>L LE, chronic  . 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 Breast surgery      05/31/04 2 lumps removed, s/p XRT and chemo  . Abdominal hysterectomy    . Breast biopsy  2005  . Urinary sling  2007  . Itp      1991 [facility] [place]    VITAL SIGNS BP 124/65 mmHg  Pulse 67  Ht 4\' 11"  (1.499 m)  Wt 168 lb (76.204 kg)  BMI 33.91 kg/m2  SpO2 98%   Outpatient Encounter Prescriptions as of 01/04/2015  Medication Sig  . aspirin 81 MG tablet Take 1 tablet (81 mg total) by mouth daily.  . beclomethasone (QVAR) 80 MCG/ACT inhaler Inhale 2 puffs into the lungs 2 (two) times daily.   . Calcium Carbonate-Vitamin D (CALTRATE 600+D) 600-400 MG-UNIT per tablet Take 1 tablet by mouth daily.  . Cholecalciferol (VITAMIN D3) 1000 UNITS CAPS Take 2 capsules (2,000 Units total) by mouth daily.  Marland Kitchen donepezil (ARICEPT) 10 MG tablet Take 1 tablet (10 mg total) by mouth at bedtime.  .  fluocinonide cream (LIDEX) 0.05 % Apply topically 2 (two) times daily as needed.  . furosemide (LASIX) 20 MG tablet Take 20 mg by mouth daily.   Marland Kitchen gabapentin (NEURONTIN) 300 MG capsule Take 1 capsule (300 mg total) by mouth at bedtime.  . magnesium hydroxide (MILK OF MAGNESIA) 400 MG/5ML suspension Take 30 mLs by mouth daily as needed for mild constipation.  . meclizine (ANTIVERT) 25 MG tablet Take 12.5 mg by mouth at bedtime as needed.  . Memantine HCl ER (NAMENDA XR) 28 MG CP24 Take 28 mg by mouth daily.  . mirtazapine (REMERON) 30 MG tablet Take 7.5 mg by mouth at bedtime.   . nitrofurantoin (MACRODANTIN) 50 MG capsule Take 1 capsule (50 mg total) by mouth at bedtime.  Marland Kitchen PARoxetine (PAXIL) 20 MG tablet Take 1 tablet (20 mg total) by mouth every morning.  . travoprost, benzalkonium, (TRAVATAN) 0.004 % ophthalmic solution Place 1 drop into both eyes daily. For glaucoma     SIGNIFICANT DIAGNOSTIC EXAMS NOT APPROPRIATE FOR DEXA; MAMMOGRAM; COLONOSCOPY     LABS REVIEWED:   12-21-13: chol 101; ldl 40; trig 128 04-04-14: liver normal albumin 3.8 04-07-14: wbc 9.4; hgb 13.6; hct 45.3; mcv 92.4; plt 238;  glucose 144; bun 16.3; creat 0.62; k+5.0; na++145; hgb a1c 7.0  10-21-14: wbc 8.2; hgb 13.2; hct 44.5 ;mcv 91.7; plt 227; glucose 214; bun 9.0; creat 0.67; k+4.5; na++142; liver normal albumin 3.4 chol 211; ldl 143; trig 175; hdl 33     ROS Unable to perform ROS   Physical Exam Constitutional: She appears well-developed and well-nourished. No distress.  Overweight   Neck: Neck supple. No JVD present. No thyromegaly present.  Cardiovascular: Normal rate, regular rhythm and intact distal pulses.   Respiratory: Effort normal and breath sounds normal. No respiratory distress.  GI: Soft. Bowel sounds are normal. She exhibits no distension. There is no tenderness.  Musculoskeletal: She exhibits no edema.  Is able to move all extremities   Neurological: She is alert.  Skin: Skin is warm. She  is not diaphoretic.     ASSESSMENT/ PLAN:   1. Dementia: no significant change; will continue aricept 10 mg daily; and namenda xr 28 mg daily; asa 81 mg daily will monitor   2. Edema: is stable will continue lasix 20 mg daily   3. Asthma: is stable will continue QVar 2 puffs twice daily will monitor   4. Peripheral neuropathy: is stable will continue neurontin 300 mg nightly will monitor  5. UTI: no recent infection present; is on long term therapy macrobid 50 mg nightly and will monitor   6. Depression: is stable will continue paxil 20 mg daily; is on remeron 7.5 mg nightly; she has gained four pounds since November 2015 from 162 pounds to 168 pounds. Will lower her remeron to every other night for one week then stop; at this time; I am not certain that this medication is providing her with any benefit  7. Glaucoma: will continue travatan to both eyes daily   8. Diabetes: is presently not on medications; will not make changes will monitor her status.    Ok Edwards NP [facility]  Contact [phone] Monday through Friday 8am- 5pm  After hours call [phone]

## 2015-01-13 ENCOUNTER — Encounter: Payer: Self-pay | Admitting: *Deleted

## 2015-02-23 ENCOUNTER — Non-Acute Institutional Stay (SKILLED_NURSING_FACILITY): Payer: Medicare Other | Admitting: Adult Health

## 2015-02-23 DIAGNOSIS — I10 Essential (primary) hypertension: Secondary | ICD-10-CM | POA: Diagnosis not present

## 2015-02-23 DIAGNOSIS — N390 Urinary tract infection, site not specified: Secondary | ICD-10-CM | POA: Diagnosis not present

## 2015-02-23 DIAGNOSIS — G629 Polyneuropathy, unspecified: Secondary | ICD-10-CM | POA: Diagnosis not present

## 2015-02-23 DIAGNOSIS — G309 Alzheimer's disease, unspecified: Secondary | ICD-10-CM | POA: Diagnosis not present

## 2015-02-23 DIAGNOSIS — E119 Type 2 diabetes mellitus without complications: Secondary | ICD-10-CM

## 2015-02-23 DIAGNOSIS — J45909 Unspecified asthma, uncomplicated: Secondary | ICD-10-CM | POA: Diagnosis not present

## 2015-02-23 DIAGNOSIS — F0280 Dementia in other diseases classified elsewhere without behavioral disturbance: Secondary | ICD-10-CM

## 2015-02-23 DIAGNOSIS — R609 Edema, unspecified: Secondary | ICD-10-CM

## 2015-02-23 DIAGNOSIS — F32A Depression, unspecified: Secondary | ICD-10-CM

## 2015-02-23 DIAGNOSIS — H409 Unspecified glaucoma: Secondary | ICD-10-CM

## 2015-02-23 DIAGNOSIS — F329 Major depressive disorder, single episode, unspecified: Secondary | ICD-10-CM

## 2015-03-23 ENCOUNTER — Non-Acute Institutional Stay (SKILLED_NURSING_FACILITY): Payer: Medicare Other | Admitting: Internal Medicine

## 2015-03-23 DIAGNOSIS — J45909 Unspecified asthma, uncomplicated: Secondary | ICD-10-CM | POA: Diagnosis not present

## 2015-03-23 DIAGNOSIS — E785 Hyperlipidemia, unspecified: Secondary | ICD-10-CM

## 2015-03-23 DIAGNOSIS — G309 Alzheimer's disease, unspecified: Secondary | ICD-10-CM | POA: Diagnosis not present

## 2015-03-23 DIAGNOSIS — E119 Type 2 diabetes mellitus without complications: Secondary | ICD-10-CM

## 2015-03-23 DIAGNOSIS — F0280 Dementia in other diseases classified elsewhere without behavioral disturbance: Secondary | ICD-10-CM

## 2015-03-23 DIAGNOSIS — F330 Major depressive disorder, recurrent, mild: Secondary | ICD-10-CM | POA: Diagnosis not present

## 2015-03-28 ENCOUNTER — Encounter: Payer: Self-pay | Admitting: Adult Health

## 2015-03-28 NOTE — Progress Notes (Signed)
Patient ID: Tamara Perez, female   DOB: 05/12/1933, 81 y.o.   MRN: [id number]  [facility]     Allergies  Allergen Reactions  . Codeine Other (See Comments)    Reaction unknown       Chief Complaint  Patient presents with  . Medical Management of Chronic Issues    HPI:  She is a long term resident of this facility being seen for the management of her chronic illnesses. She cannot fully participate in the hpi or ros. Overall there is little change in her status. There are no nursing concerns at this time.     Past Medical History  Diagnosis Date  . ADENOCARCINOMA, BREAST   . DYSLIPIDEMIA   . DEMENTIA   . DEPRESSION   . ASTHMA   . GAIT DISTURBANCE   . Edema   . URINARY INCONTINENCE   . TRANSIENT ISCHEMIC ATTACK, HX OF   . DIVERTICULITIS, HX OF   . HYPERGLYCEMIA   . Venous stasis dermatitis     R>L LE, chronic  . 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 Breast surgery      05/31/04 2 lumps removed, s/p XRT and chemo  . Abdominal hysterectomy    . Breast biopsy  2005  . Urinary sling  2007  . Itp      1991 [facility] [place]    VITAL SIGNS BP 112/57 mmHg  Pulse 60  Ht 4\' 11"  (1.499 m)  Wt 159 lb (72.122 kg)  BMI 32.10 kg/m2  SpO2 97%   Outpatient Encounter Prescriptions as of 02/23/2015  Medication Sig  . aspirin 81 MG tablet Take 1 tablet (81 mg total) by mouth daily.  . beclomethasone (QVAR) 80 MCG/ACT inhaler Inhale 2 puffs into the lungs 2 (two) times daily.   . Calcium Carbonate-Vitamin D (CALTRATE 600+D) 600-400 MG-UNIT per tablet Take 1 tablet by mouth daily.  . Cholecalciferol (VITAMIN D3) 1000 UNITS CAPS Take 2 capsules (2,000 Units total) by mouth daily.  Marland Kitchen donepezil (ARICEPT) 10 MG tablet Take 1 tablet (10 mg total) by mouth at bedtime.  .  fluocinonide cream (LIDEX) 0.05 % Apply topically 2 (two) times daily as needed.  . furosemide (LASIX) 20 MG tablet Take 20 mg by mouth daily.   Marland Kitchen gabapentin (NEURONTIN) 300 MG capsule Take 1 capsule (300 mg total) by mouth at bedtime.  . magnesium hydroxide (MILK OF MAGNESIA) 400 MG/5ML suspension Take 30 mLs by mouth daily as needed for mild constipation.  . meclizine (ANTIVERT) 25 MG tablet Take 12.5 mg by mouth at bedtime as needed.  . Memantine HCl ER (NAMENDA XR) 28 MG CP24 Take 28 mg by mouth daily.  . nitrofurantoin (MACRODANTIN) 50 MG capsule Take 1 capsule (50 mg total) by mouth at bedtime.  Marland Kitchen PARoxetine (PAXIL) 20 MG tablet Take 1 tablet (20 mg total) by mouth every morning.  . travoprost, benzalkonium, (TRAVATAN) 0.004 % ophthalmic solution Place 1 drop into both eyes daily. For glaucoma      SIGNIFICANT DIAGNOSTIC EXAMS   NOT APPROPRIATE FOR DEXA; MAMMOGRAM; COLONOSCOPY     LABS REVIEWED:   12-21-13: chol 101; ldl 40; trig 128 04-04-14: liver normal albumin 3.8 04-07-14: wbc 9.4; hgb 13.6; hct 45.3; mcv 92.4; plt 238; glucose 144; bun 16.3; creat 0.62; k+5.0; na++145; hgb a1c 7.0  10-21-14: wbc 8.2; hgb 13.2; hct 44.5 ;mcv 91.7; plt 227; glucose 214; bun 9.0; creat 0.67; k+4.5; na++142; liver normal albumin 3.4 chol 211; ldl 143; trig 175; hdl 33  01-05-15: urine for micro-albumin 2.8; hgb a1c 8.2     Review of Systems  Unable to perform ROS: dementia     Physical Exam Constitutional: She appears well-developed and well-nourished. No distress.  Overweight   Neck: Neck supple. No JVD present. No thyromegaly present.  Cardiovascular: Normal rate, regular rhythm and intact distal pulses.   Respiratory: Effort normal and breath sounds normal. No respiratory distress.  GI: Soft. Bowel sounds are normal. She exhibits no distension. There is no tenderness.  Musculoskeletal: She exhibits no edema.  Is able to move all extremities   Neurological: She is alert.  Skin: Skin  is warm. She is not diaphoretic.     ASSESSMENT/ PLAN:  1. Dementia: no significant change; will continue aricept 10 mg daily; and namenda xr 28 mg daily; asa 81 mg daily will monitor   2. Edema: is stable will continue lasix 20 mg daily   3. Asthma: is stable will continue QVar 2 puffs twice daily will monitor   4. Peripheral neuropathy: is stable will continue neurontin 300 mg nightly will monitor  5. UTI: no recent infection present; is on long term therapy macrobid 50 mg nightly and will monitor   6. Depression: is stable will continue paxil 20 mg daily; will monitor   7. Glaucoma: will continue travatan to both eyes daily   8. Diabetes: is presently not on medications; will not make changes will monitor her status; her hgb a1c is 8.2     Ok Edwards NP [facility]  Contact [phone] Monday through Friday 8am- 5pm  After hours call [phone]

## 2015-03-30 NOTE — Progress Notes (Signed)
Patient ID: Tamara Perez, female   DOB: August 09, 1933, 81 y.o.   MRN: [id number]    DATE: 03/23/15  Location:  [facility]    Place of Service: [id number]   Extended Emergency Contact Information Primary Emergency Contact: [place] Phone: [phone] Work Phone: [phone] Relation: Daughter  Advanced Directive information  DNR  Chief Complaint  Patient presents with  . Medical Management of Chronic Issues    HPI:  81 yo female long term resident seen today for f/u. She has no c/o. No nursing issues. Appetite reduced. Sleeping well. She is a poor historian due to dementia. Hx obtained from chart.  Dementia - stable on aricept, namenda xr. She takes nutritional supplementsTID  Edema: stable on lasix  Asthma controlled on QVar   Peripheral neuropathy - stable on neurontin   UTI recurrent - stable on long term macrobid   MDD - stable on paxil  DM - BS not monitored and she takes no meds. Last A1c 8.2%   Past Medical History  Diagnosis Date  . ADENOCARCINOMA, BREAST   . DYSLIPIDEMIA   . DEMENTIA   . DEPRESSION   . ASTHMA   . GAIT DISTURBANCE   . Edema   . URINARY INCONTINENCE   . TRANSIENT ISCHEMIC ATTACK, HX OF   . DIVERTICULITIS, HX OF   . HYPERGLYCEMIA   . Venous stasis dermatitis     R>L LE, chronic  . Type II or unspecified type diabetes mellitus with neurological manifestations, uncontrolled     Qualifier: Diagnosis of  By: Asa Lente MD, Jannifer Rodney Breast cancer [facility])     Past Surgical History  Procedure Laterality Date  . Breast surgery      05/31/04 2 lumps removed, s/p XRT and chemo  . Abdominal hysterectomy    . Breast biopsy  2005  . Urinary sling  2007  . [facility] [place]    Patient Care Team: Hennie Duos, MD as PCP - General (Internal Medicine) Larey Seat, MD as Consulting Physician (Neurology) Chauncey Cruel, MD as  Consulting Physician (Hematology and Oncology) Druscilla Brownie, MD (Dermatology) Gerlene Fee, NP as Nurse Practitioner (Geriatric Medicine) [place] ([place])  Social History   Social History  . Marital Status: Married    Spouse Name: N/A  . Number of Children: N/A  . Years of Education: N/A   Occupational History  . Not on file.   Social History Main Topics  . Smoking status: Never Smoker   . Smokeless tobacco: Not on file     Comment: lives with [facility] at [facility]  . Alcohol Use: No  . Drug Use: No  . Sexual Activity: Not on file   Other Topics Concern  . Not on file   Social History Narrative     reports that she has never smoked. She does not have any smokeless tobacco history on file. She reports that she does not drink alcohol or use illicit drugs.  Immunization History  Administered Date(s) Administered  . Influenza Split 06/19/2011  . Influenza Whole 06/16/2009, 06/28/2013  . Influenza-Unspecified 06/27/2014  . PPD Test 10/17/2012  . Pneumococcal Polysaccharide-23 09/17/2007  . Td 12/29/2008    Allergies  Allergen Reactions  . Codeine Other (See Comments)    Reaction unknown    Medications: Patient's Medications  New Prescriptions   ATORVASTATIN (LIPITOR)  10 MG TABLET    Take 1 tablet (10 mg total) by mouth daily.   CRANBERRY 450 MG CAPS    Take 450 mg by mouth daily.   VITAMIN C (ASCORBIC ACID) 500 MG TABLET    Take 1 tablet (500 mg total) by mouth daily.  Previous Medications   ASPIRIN 81 MG TABLET    Take 1 tablet (81 mg total) by mouth daily.   BECLOMETHASONE (QVAR) 80 MCG/ACT INHALER    Inhale 2 puffs into the lungs 2 (two) times daily.    CALCIUM CARBONATE-VITAMIN D (CALTRATE 600+D) 600-400 MG-UNIT PER TABLET    Take 1 tablet by mouth daily.   CHOLECALCIFEROL (VITAMIN D3) 1000 UNITS CAPS    Take 2 capsules (2,000 Units total) by mouth daily.   DONEPEZIL (ARICEPT) 10 MG TABLET    Take 1  tablet (10 mg total) by mouth at bedtime.   FLUOCINONIDE CREAM (LIDEX) 0.05 %    Apply topically 2 (two) times daily as needed.   FUROSEMIDE (LASIX) 20 MG TABLET    Take 20 mg by mouth daily.    GABAPENTIN (NEURONTIN) 300 MG CAPSULE    Take 1 capsule (300 mg total) by mouth at bedtime.   MAGNESIUM HYDROXIDE (MILK OF MAGNESIA) 400 MG/5ML SUSPENSION    Take 30 mLs by mouth daily as needed for mild constipation.   MEMANTINE HCL ER (NAMENDA XR) 28 MG CP24    Take 28 mg by mouth daily.   PAROXETINE (PAXIL) 20 MG TABLET    Take 1 tablet (20 mg total) by mouth every morning.   TRAVOPROST, BENZALKONIUM, (TRAVATAN) 0.004 % OPHTHALMIC SOLUTION    Place 1 drop into both eyes daily. For glaucoma  Modified Medications   No medications on file  Discontinued Medications   MECLIZINE (ANTIVERT) 25 MG TABLET    Take 12.5 mg by mouth at bedtime as needed.   NITROFURANTOIN (MACRODANTIN) 50 MG CAPSULE    Take 1 capsule (50 mg total) by mouth at bedtime.    Review of Systems  Unable to perform ROS: Dementia    Filed Vitals:   03/23/15 2325  BP: 116/52  Pulse: 68  Temp: 97.2 F (36.2 C)  Weight: 161 lb (73.029 kg)  SpO2: 98%   Body mass index is 32.5 kg/(m^2).  Physical Exam  Constitutional: She appears well-developed.  Sitting in w/c in NAD, frail appearing  HENT:  Mouth/Throat: Oropharynx is clear and moist. No oropharyngeal exudate.  Eyes: Pupils are equal, round, and reactive to light. No scleral icterus.  Neck: Neck supple. Carotid bruit is not present. No tracheal deviation present. No thyromegaly present.  Cardiovascular: Normal rate, regular rhythm, normal heart sounds and intact distal pulses.  Exam reveals no gallop and no friction rub.   No murmur heard. No LE edema b/l. no calf TTP.   Pulmonary/Chest: Effort normal. No stridor. No respiratory distress.  Reduced BS at base with scattered end expiratory wheezing. No rales or rhonchi  Abdominal: Soft. Bowel sounds are normal. She exhibits  no distension and no mass. There is no hepatomegaly. There is no tenderness. There is no rebound and no guarding.  Musculoskeletal: She exhibits edema.  Lymphadenopathy:    She has no cervical adenopathy.  Neurological: She is alert.  Skin: Skin is warm and dry. No rash noted.  Psychiatric: She has a normal mood and affect. Her behavior is normal.     Labs reviewed: No visits with results within 3 Month(s) from this visit. Latest known  visit with results is:  Nursing Home on 07/06/2014  Component Date Value Ref Range Status  . Hemoglobin 04/07/2014 13.6  12.0 - 16.0 g/dL Final  . HCT 04/07/2014 45  36 - 46 % Final  . Platelets 04/07/2014 238  150 - 399 K/L Final  . WBC 04/07/2014 9.4   Final  . Glucose 04/07/2014 144   Final  . BUN 04/07/2014 16  4 - 21 mg/dL Final  . Creatinine 04/07/2014 0.6  0.5 - 1.1 mg/dL Final  . Potassium 04/07/2014 5.0  3.4 - 5.3 mmol/L Final  . Sodium 04/07/2014 145  137 - 147 mmol/L Final  . ALT 04/07/2014 17  7 - 35 U/L Final  . AST 04/07/2014 18  13 - 35 U/L Final  . Hgb A1c MFr Bld 04/07/2014 7.0* 4.0 - 6.0 % Final    No results found.   Assessment/Plan   ICD-9-CM ICD-10-CM   1. Alzheimer's dementia without behavioral disturbance, unspecified timing of dementia onset 331.0 G30.9    294.10 F02.80   2. Mild episode of recurrent major depressive disorder (HCC) 296.31 F33.0   3. Asthma, unspecified asthma severity, uncomplicated 680.32 Z22.482   4. Type 2 diabetes mellitus without complication, without long-term current use of insulin (HCC) 250.00 E11.9   5. Hyperlipidemia 272.4 E78.5     --check cmp, a1c and lipid panel  --continue nutritional supplement as ordered  --Pt is medically stable on current tx plan. Continue current medications as ordered. PT/OT/ST as indicated. Will follow  Ryen Rhames S. Perlie Gold  [facility] [address] [place], [place] [address] [phone] Cell  (Monday-Friday 8 AM - 5 PM) [phone] After 5 PM and follow prompts

## 2015-04-25 ENCOUNTER — Encounter: Payer: Self-pay | Admitting: Adult Health

## 2015-04-25 ENCOUNTER — Non-Acute Institutional Stay (SKILLED_NURSING_FACILITY): Payer: Medicare Other | Admitting: Adult Health

## 2015-04-25 DIAGNOSIS — R609 Edema, unspecified: Secondary | ICD-10-CM

## 2015-04-25 DIAGNOSIS — G309 Alzheimer's disease, unspecified: Secondary | ICD-10-CM

## 2015-04-25 DIAGNOSIS — J45909 Unspecified asthma, uncomplicated: Secondary | ICD-10-CM

## 2015-04-25 DIAGNOSIS — I10 Essential (primary) hypertension: Secondary | ICD-10-CM

## 2015-04-25 DIAGNOSIS — E119 Type 2 diabetes mellitus without complications: Secondary | ICD-10-CM

## 2015-04-25 DIAGNOSIS — F329 Major depressive disorder, single episode, unspecified: Secondary | ICD-10-CM | POA: Diagnosis not present

## 2015-04-25 DIAGNOSIS — F0280 Dementia in other diseases classified elsewhere without behavioral disturbance: Secondary | ICD-10-CM

## 2015-04-25 DIAGNOSIS — H409 Unspecified glaucoma: Secondary | ICD-10-CM

## 2015-04-25 DIAGNOSIS — G629 Polyneuropathy, unspecified: Secondary | ICD-10-CM

## 2015-04-25 DIAGNOSIS — E785 Hyperlipidemia, unspecified: Secondary | ICD-10-CM | POA: Diagnosis not present

## 2015-04-25 DIAGNOSIS — N390 Urinary tract infection, site not specified: Secondary | ICD-10-CM

## 2015-04-25 DIAGNOSIS — F32A Depression, unspecified: Secondary | ICD-10-CM

## 2015-04-25 MED ORDER — ATORVASTATIN CALCIUM 10 MG PO TABS: 10.0000 mg | ORAL_TABLET | Freq: Every day | ORAL | Status: DC

## 2015-04-25 NOTE — Progress Notes (Signed)
Patient ID: Tamara Perez, female   DOB: 1933-07-01, 81 y.o.   MRN: [id number]   Facility: [facility]      Allergies  Allergen Reactions  . Codeine Other (See Comments)    Reaction unknown    Chief Complaint  Patient presents with  . Medical Management of Chronic Issues    HPI:  She is a long term resident of this facility being seen for the management of her chronic illnesses. Overall her status remains without change. She is unable to fully participate in the hpi or ros. There are no nursing concerns at this time.     Past Medical History  Diagnosis Date  . ADENOCARCINOMA, BREAST   . DYSLIPIDEMIA   . DEMENTIA   . DEPRESSION   . ASTHMA   . GAIT DISTURBANCE   . Edema   . URINARY INCONTINENCE   . TRANSIENT ISCHEMIC ATTACK, HX OF   . DIVERTICULITIS, HX OF   . HYPERGLYCEMIA   . Venous stasis dermatitis     R>L LE, chronic  . 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 Breast surgery      05/31/04 2 lumps removed, s/p XRT and chemo  . Abdominal hysterectomy    . Breast biopsy  2005  . Urinary sling  2007  . Itp      1991 [facility]    VITAL SIGNS BP 107/62 mmHg  Pulse 83  Ht 4\' 11"  (1.499 m)  Wt 156 lb (70.761 kg)  BMI 31.49 kg/m2  SpO2 98%  Patient's Medications  New Prescriptions   No medications on file  Previous Medications   ASPIRIN 81 MG TABLET    Take 1 tablet (81 mg total) by mouth daily.   BECLOMETHASONE (QVAR) 80 MCG/ACT INHALER    Inhale 2 puffs into the lungs 2 (two) times daily.    CALCIUM CARBONATE-VITAMIN D (CALTRATE 600+D) 600-400 MG-UNIT PER TABLET    Take 1 tablet by mouth daily.   CHOLECALCIFEROL (VITAMIN D3) 1000 UNITS CAPS    Take 2 capsules (2,000 Units total) by mouth daily.   DONEPEZIL (ARICEPT) 10 MG TABLET    Take 1  tablet (10 mg total) by mouth at bedtime.   FLUOCINONIDE CREAM (LIDEX) 0.05 %    Apply topically 2 (two) times daily as needed.   FUROSEMIDE (LASIX) 20 MG TABLET    Take 20 mg by mouth daily.    GABAPENTIN (NEURONTIN) 300 MG CAPSULE    Take 1 capsule (300 mg total) by mouth at bedtime.   MAGNESIUM HYDROXIDE (MILK OF MAGNESIA) 400 MG/5ML SUSPENSION    Take 30 mLs by mouth daily as needed for mild constipation.   MEMANTINE HCL ER (NAMENDA XR) 28 MG CP24    Take 28 mg by mouth daily.   NITROFURANTOIN (MACRODANTIN) 50 MG CAPSULE    Take 1 capsule (50 mg total) by mouth at bedtime.   PAROXETINE (PAXIL) 20 MG TABLET    Take 1 tablet (20 mg total) by mouth every morning.   TRAVOPROST, BENZALKONIUM, (TRAVATAN) 0.004 % OPHTHALMIC SOLUTION    Place 1 drop into both eyes daily. For glaucoma  Modified Medications   No medications on file  Discontinued Medications   MECLIZINE (ANTIVERT) 25 MG TABLET    Take 12.5 mg by mouth at bedtime  as needed.     SIGNIFICANT DIAGNOSTIC EXAM NOT APPROPRIATE FOR DEXA; MAMMOGRAM; COLONOSCOPY     LABS REVIEWED:   10-21-14: wbc 8.2; hgb 13.2; hct 44.5 ;mcv 91.7; plt 227; glucose 214; bun 9.0; creat 0.67; k+4.5; na++142; liver normal albumin 3.4 chol 211; ldl 143; trig 175; hdl 33  01-05-15: urine for micro-albumin 2.8; hgb a1c 8.2  04-10-15: glucose 183; bun 15.7; creat 0.61; k+ 3.7; na++ 142; liver normal albumin 3.4; hgb a1c 7.3; chol 203; ldl 140; trig 194; hdl 24   Review of Systems  Unable to perform ROS: Dementia      Physical Exam  Constitutional: No distress.  Is overweight   Eyes: Conjunctivae are normal.  Neck: Neck supple. No JVD present. No thyromegaly present.  Cardiovascular: Normal rate, regular rhythm and intact distal pulses.   Respiratory: Effort normal and breath sounds normal. No respiratory distress. She has no wheezes.  GI: Soft. Bowel sounds are normal. She exhibits no distension. There is no tenderness.  Musculoskeletal: She exhibits no  edema.  Able to move all extremities   Lymphadenopathy:    She has no cervical adenopathy.  Neurological: She is alert.  Skin: Skin is warm and dry. She is not diaphoretic.  Psychiatric: She has a normal mood and affect.       ASSESSMENT/ PLAN:  1. Dementia: no significant change; will continue aricept 10 mg daily; and namenda xr 28 mg daily; asa 81 mg daily will monitor   2. Edema: is stable will continue lasix 20 mg daily   3. Asthma: is stable will continue QVar 2 puffs twice daily will monitor   4. Peripheral neuropathy: is stable will continue neurontin 300 mg nightly will monitor  5. UTI: no recent infection present; is on long term therapy macrobid 50 mg nightly and will monitor   6. Depression: is stable will continue paxil 20 mg daily; she does continue to benefit from this medication;  will monitor   7. Glaucoma: will continue travatan to both eyes daily   8. Diabetes: is presently not on medications; will not make changes will monitor her status; her hgb a1c is 7.3  9. Dyslipidemia: her ldl is 140; trig 194; will begin lipitor 10 mg nightly and will check lipids and liver function in 2 months      Ok Edwards NP [facility]  Contact [phone] Monday through Friday 8am- 5pm  After hours call [phone]

## 2015-05-25 ENCOUNTER — Non-Acute Institutional Stay (SKILLED_NURSING_FACILITY): Payer: Medicare Other | Admitting: Adult Health

## 2015-05-25 DIAGNOSIS — N390 Urinary tract infection, site not specified: Secondary | ICD-10-CM

## 2015-05-25 DIAGNOSIS — G309 Alzheimer's disease, unspecified: Secondary | ICD-10-CM | POA: Diagnosis not present

## 2015-05-25 DIAGNOSIS — G629 Polyneuropathy, unspecified: Secondary | ICD-10-CM

## 2015-05-25 DIAGNOSIS — H409 Unspecified glaucoma: Secondary | ICD-10-CM | POA: Diagnosis not present

## 2015-05-25 DIAGNOSIS — R609 Edema, unspecified: Secondary | ICD-10-CM | POA: Diagnosis not present

## 2015-05-25 DIAGNOSIS — E119 Type 2 diabetes mellitus without complications: Secondary | ICD-10-CM | POA: Diagnosis not present

## 2015-05-25 DIAGNOSIS — E785 Hyperlipidemia, unspecified: Secondary | ICD-10-CM | POA: Diagnosis not present

## 2015-05-25 DIAGNOSIS — J45909 Unspecified asthma, uncomplicated: Secondary | ICD-10-CM | POA: Diagnosis not present

## 2015-05-25 DIAGNOSIS — F0280 Dementia in other diseases classified elsewhere without behavioral disturbance: Secondary | ICD-10-CM

## 2015-05-25 DIAGNOSIS — I10 Essential (primary) hypertension: Secondary | ICD-10-CM | POA: Diagnosis not present

## 2015-06-12 ENCOUNTER — Encounter: Payer: Self-pay | Admitting: Adult Health

## 2015-06-12 NOTE — Progress Notes (Signed)
Patient ID: Tamara Perez, female   DOB: 12/21/1932, 81 y.o.   MRN: [id number]    Facility: [facility]      Allergies  Allergen Reactions  . Codeine Other (See Comments)    Reaction unknown    Chief Complaint  Patient presents with  . Medical Management of Chronic Issues    HPI:  She is a long term resident of this facility being seen for the management of her chronic illnesses. Her status remains without significant change. She is unable to fully participate in the hpi or ros; but states that she is feeling good. There are no nursing concerns at this time.   Past Medical History  Diagnosis Date  . ADENOCARCINOMA, BREAST   . DYSLIPIDEMIA   . DEMENTIA   . DEPRESSION   . ASTHMA   . GAIT DISTURBANCE   . Edema   . URINARY INCONTINENCE   . TRANSIENT ISCHEMIC ATTACK, HX OF   . DIVERTICULITIS, HX OF   . HYPERGLYCEMIA   . Venous stasis dermatitis     R>L LE, chronic  . Type II or unspecified type diabetes mellitus with neurological manifestations, uncontrolled     Qualifier: Diagnosis of  By: Asa Lente MD, Jannifer Rodney Breast cancer     Past Surgical History  Procedure Laterality Date  . Breast surgery      05/31/04 2 lumps removed, s/p XRT and chemo  . Abdominal hysterectomy    . Breast biopsy  2005  . Urinary sling  2007  . Itp      1991 [facility]    VITAL SIGNS BP 119/47 mmHg  Pulse 57  Ht 4\' 11"  (1.499 m)  Wt 155 lb (70.308 kg)  BMI 31.29 kg/m2  SpO2 96%  Patient's Medications  New Prescriptions   No medications on file  Previous Medications   ASPIRIN 81 MG TABLET    Take 1 tablet (81 mg total) by mouth daily.   ATORVASTATIN (LIPITOR) 10 MG TABLET    Take 1 tablet (10 mg total) by mouth daily.   BECLOMETHASONE (QVAR) 80 MCG/ACT INHALER    Inhale 2 puffs into the lungs 2 (two) times daily.    CALCIUM CARBONATE-VITAMIN D (CALTRATE 600+D) 600-400 MG-UNIT PER TABLET    Take 1 tablet by mouth daily.   CHOLECALCIFEROL (VITAMIN D3) 1000 UNITS CAPS    Take 2 capsules (2,000 Units total) by mouth daily.   DONEPEZIL (ARICEPT) 10 MG TABLET    Take 1 tablet (10 mg total) by mouth at bedtime.   FLUOCINONIDE CREAM (LIDEX) 0.05 %    Apply topically 2 (two) times daily as needed.   FUROSEMIDE (LASIX) 20 MG TABLET    Take 20 mg by mouth daily.    GABAPENTIN (NEURONTIN) 300 MG CAPSULE    Take 1 capsule (300 mg total) by mouth at bedtime.   MAGNESIUM HYDROXIDE (MILK OF MAGNESIA) 400 MG/5ML SUSPENSION    Take 30 mLs by mouth daily as needed for mild constipation.   MEMANTINE HCL ER (NAMENDA XR) 28 MG CP24    Take 28 mg by mouth daily.   NITROFURANTOIN (MACRODANTIN) 50 MG CAPSULE    Take 1 capsule (50 mg total) by mouth at bedtime.   PAROXETINE (PAXIL) 20 MG TABLET    Take 1 tablet (20 mg total) by mouth every morning.   TRAVOPROST, BENZALKONIUM, (TRAVATAN) 0.004 % OPHTHALMIC SOLUTION    Place 1 drop into both eyes daily. For glaucoma  Modified Medications   No medications on file  Discontinued Medications   No medications on file     SIGNIFICANT DIAGNOSTIC EXAMS  NOT APPROPRIATE FOR DEXA; MAMMOGRAM; COLONOSCOPY   LABS REVIEWED:   10-21-14: wbc 8.2; hgb 13.2; hct 44.5 ;mcv 91.7; plt 227; glucose 214; bun 9.0; creat 0.67; k+4.5; na++142; liver normal albumin 3.4 chol 211; ldl 143; trig 175; hdl 33  01-05-15: urine for micro-albumin 2.8; hgb a1c 8.2  04-10-15: glucose 183; bun 15.7; creat 0.61; k+ 3.7; na++ 142; liver normal albumin 3.4; hgb a1c 7.3; chol 203; ldl 140; trig 194; hdl 24     Review of Systems Unable to perform ROS: Dementia    Physical Exam Constitutional: No distress.  Is overweight   Eyes: Conjunctivae are normal.  Neck: Neck supple. No JVD present. No thyromegaly present.  Cardiovascular: Normal rate, regular rhythm and intact distal pulses.   Respiratory: Effort normal and breath sounds normal. No respiratory distress. She has no wheezes.  GI: Soft. Bowel sounds are normal.  She exhibits no distension. There is no tenderness.  Musculoskeletal: She exhibits no edema.  Able to move all extremities   Lymphadenopathy:    She has no cervical adenopathy.  Neurological: She is alert.  Skin: Skin is warm and dry. She is not diaphoretic.  Psychiatric: She has a normal mood and affect.     ASSESSMENT/ PLAN:  1. Dementia: no significant change; will continue aricept 10 mg daily; and namenda xr 28 mg daily; asa 81 mg daily will monitor  Her current weight is 155 pounds.   2. Edema: is stable will continue lasix 20 mg daily   3. Asthma: is stable will continue QVar 2 puffs twice daily will monitor   4. Peripheral neuropathy: is stable will continue neurontin 300 mg nightly will monitor  5. UTI: no recent infection present; is on long term therapy macrobid 50 mg nightly and will monitor   6. Depression: is stable will continue paxil 20 mg daily; she does continue to benefit from this medication;  will monitor   7. Glaucoma: will continue travatan to both eyes daily   8. Diabetes: is presently not on medications; will not make changes will monitor her status; her hgb a1c is 7.3  9. Dyslipidemia: her ldl is 140; trig 194; will continue lipitor 10 mg nightly      Ok Edwards NP [facility]  Contact [phone] Monday through Friday 8am- 5pm  After hours call [phone]

## 2015-07-10 ENCOUNTER — Non-Acute Institutional Stay (SKILLED_NURSING_FACILITY): Payer: Medicare Other | Admitting: Internal Medicine

## 2015-07-10 ENCOUNTER — Encounter: Payer: Self-pay | Admitting: Internal Medicine

## 2015-07-10 DIAGNOSIS — E559 Vitamin D deficiency, unspecified: Secondary | ICD-10-CM | POA: Diagnosis not present

## 2015-07-10 DIAGNOSIS — I10 Essential (primary) hypertension: Secondary | ICD-10-CM

## 2015-07-10 NOTE — Assessment & Plan Note (Signed)
Chronic and stable without c/o;plan - continue 300 mg q HS

## 2015-07-10 NOTE — Assessment & Plan Note (Signed)
Chronic and stable; Pt has been receiving 2000 u daily of Vit D; time to check Vit d level

## 2015-07-10 NOTE — Progress Notes (Signed)
MRN: [id number] Name: Tamara Perez  Sex: female Age: 81 y.o. DOB: 1933/05/25  [place] #: [facility] Facility/Room:224b Level Of Care: SNF Provider: Inocencio Homes D Emergency Contacts: Extended Emergency Contact Information Primary Emergency Contact: [place] Phone: [phone] Work Phone: [phone] Relation: Daughter  Code Status:   Allergies: Codeine  Chief Complaint  Patient presents with  . Acute Visit    HPI: Patient is 81 y.o. female with dementia, HLD, DM2, depression being seen today for routine issues of HTN, Vit D def and diabetic neuropathy.   Past Medical History  Diagnosis Date  . ADENOCARCINOMA, BREAST   . DYSLIPIDEMIA   . DEMENTIA   . DEPRESSION   . ASTHMA   . GAIT DISTURBANCE   . Edema   . URINARY INCONTINENCE   . TRANSIENT ISCHEMIC ATTACK, HX OF   . DIVERTICULITIS, HX OF   . HYPERGLYCEMIA   . Venous stasis dermatitis     R>L LE, chronic  . Type II or unspecified type diabetes mellitus with neurological manifestations, uncontrolled     Qualifier: Diagnosis of  By: Asa Lente MD, Jannifer Rodney Breast cancer [facility])     Past Surgical History  Procedure Laterality Date  . Breast surgery      05/31/04 2 lumps removed, s/p XRT and chemo  . Abdominal hysterectomy    . Breast biopsy  2005  . Urinary sling  2007  . Itp      1991 [facility] [place]      Medication List       This list is accurate as of: 07/10/15  5:21 PM.  Always use your most recent med list.               aspirin 81 MG tablet  Take 1 tablet (81 mg total) by mouth daily.     atorvastatin 10 MG tablet  Commonly known as:  LIPITOR  Take 1 tablet (10 mg total) by mouth daily.     beclomethasone 80 MCG/ACT inhaler  Commonly known as:  QVAR  Inhale 2 puffs into the lungs 2 (two) times daily.     Calcium Carbonate-Vitamin D 600-400 MG-UNIT tablet  Commonly known as:  CALTRATE 600+D  Take 1 tablet by mouth  daily.     donepezil 10 MG tablet  Commonly known as:  ARICEPT  Take 1 tablet (10 mg total) by mouth at bedtime.     fluocinonide cream 0.05 %  Commonly known as:  LIDEX  Apply topically 2 (two) times daily as needed.     furosemide 20 MG tablet  Commonly known as:  LASIX  Take 20 mg by mouth daily.     gabapentin 300 MG capsule  Commonly known as:  NEURONTIN  Take 1 capsule (300 mg total) by mouth at bedtime.     magnesium hydroxide 400 MG/5ML suspension  Commonly known as:  MILK OF MAGNESIA  Take 30 mLs by mouth daily as needed for mild constipation.     NAMENDA XR 28 MG Cp24 24 hr capsule  Generic drug:  memantine  Take 28 mg by mouth daily.     nitrofurantoin 50 MG capsule  Commonly known as:  MACRODANTIN  Take 1 capsule (50 mg total) by mouth at bedtime.     PARoxetine 20 MG tablet  Commonly known as:  PAXIL  Take 1 tablet (20 mg total) by mouth every morning.     travoprost (benzalkonium) 0.004 % ophthalmic  solution  Commonly known as:  TRAVATAN  Place 1 drop into both eyes daily. For glaucoma     Vitamin D3 1000 UNITS Caps  Take 2 capsules (2,000 Units total) by mouth daily.        No orders of the defined types were placed in this encounter.    Immunization History  Administered Date(s) Administered  . Influenza Split 06/19/2011  . Influenza Whole 06/16/2009, 06/28/2013  . Influenza-Unspecified 06/27/2014  . PPD Test 10/17/2012  . Pneumococcal Polysaccharide-23 09/17/2007  . Td 12/29/2008    Social History  Substance Use Topics  . Smoking status: Never Smoker   . Smokeless tobacco: Not on file     Comment: lives with spouse at [facility]  . Alcohol Use: No    Review of Systems  DATA OBTAINED: from nurse -  has no concerns; pt cannot 2/2 dementia  Filed Vitals:   07/10/15 1711  BP: 114/64  Pulse: 77  Temp: 97.6 F (36.4 C)  Resp: 18    Physical Exam  GENERAL APPEARANCE: Alert,  No acute distress  SKIN: No diaphoresis  rash, HEENT: Unremarkable RESPIRATORY: Breathing is even, unlabored. Lung sounds are clear   CARDIOVASCULAR: Heart RRR no murmurs, rubs or gallops. No peripheral edema  GASTROINTESTINAL: Abdomen is soft, non-tender, not distended w/ normal bowel sounds.  GENITOURINARY: Bladder non tender, not distended  MUSCULOSKELETAL: No abnormal joints or musculature NEUROLOGIC: Cranial nerves 2-12 grossly intact. Moves all extremities PSYCHIATRIC: dementia, no behavioral issues  Patient Active Problem List   Diagnosis Date Noted  . Vitamin D deficiency 07/10/2015  . Diabetes mellitus type II, controlled ([place]) 04/25/2015  . Hyperlipidemia 10/23/2014  . Glaucoma 10/23/2014  . Essential hypertension, benign 10/23/2014  . Alzheimer's disease 10/23/2014  . Chronic UTI 02/26/2013  . Diabetic polyneuropathy ([place]) 02/26/2013  . EDEMA 05/24/2009  . Depression 12/29/2008  . Asthma 12/29/2008    CBC    Component Value Date/Time   WBC 9.4 04/07/2014   WBC 8.3 06/22/2012 1402   WBC 7.5 01/31/2011 1409   RBC 4.90 06/22/2012 1402   RBC 4.46 01/31/2011 1409   HGB 13.6 04/07/2014   HGB 13.7 01/31/2011 1409   HCT 45 04/07/2014   HCT 40.9 01/31/2011 1409   PLT 238 04/07/2014   PLT 221 01/31/2011 1409   MCV 93.4 06/22/2012 1402   MCV 91.7 01/31/2011 1409   LYMPHSABS 1.7 06/22/2012 1402   LYMPHSABS 1.5 01/31/2011 1409   MONOABS 0.6 06/22/2012 1402   MONOABS 0.7 01/31/2011 1409   EOSABS 0.2 06/22/2012 1402   EOSABS 0.2 01/31/2011 1409   BASOSABS 0.1 06/22/2012 1402   BASOSABS 0.0 01/31/2011 1409    CMP     Component Value Date/Time   NA 145 04/07/2014   NA 135 06/22/2012 1402   K 5.0 04/07/2014   CL 96 06/22/2012 1402   CO2 30 06/22/2012 1402   GLUCOSE 129* 06/22/2012 1402   BUN 16 04/07/2014   BUN 6 06/22/2012 1402   CREATININE 0.6 04/07/2014   CREATININE 0.7 06/22/2012 1402   CALCIUM 9.5 06/22/2012 1402   PROT 7.4 11/15/2011 1351   ALBUMIN 3.4* 11/15/2011 1351   AST 18 04/07/2014    ALT 17 04/07/2014   ALKPHOS 103 11/15/2011 1351   BILITOT 0.3 11/15/2011 1351   GFRNONAA 81* 11/15/2011 1351   GFRAA >90 11/15/2011 1351    Assessment and Plan  Vitamin D deficiency Chronic and stable; Pt has been receiving 2000 u daily of Vit D; time  to check Vit d level  Diabetic polyneuropathy (HCC) Chronic and stable without c/o;plan - continue 300 mg q HS  Essential hypertension, benign Chronic and stable; plan - cont lasix 20 mg daily    Hennie Duos, MD

## 2015-07-10 NOTE — Assessment & Plan Note (Signed)
Chronic and stable; plan - cont lasix 20 mg daily

## 2015-08-16 ENCOUNTER — Non-Acute Institutional Stay (SKILLED_NURSING_FACILITY): Payer: Medicare Other | Admitting: Adult Health

## 2015-08-16 ENCOUNTER — Encounter: Payer: Self-pay | Admitting: Adult Health

## 2015-08-16 DIAGNOSIS — E1169 Type 2 diabetes mellitus with other specified complication: Secondary | ICD-10-CM

## 2015-08-16 DIAGNOSIS — I10 Essential (primary) hypertension: Secondary | ICD-10-CM

## 2015-08-16 DIAGNOSIS — E785 Hyperlipidemia, unspecified: Secondary | ICD-10-CM

## 2015-08-16 DIAGNOSIS — G301 Alzheimer's disease with late onset: Secondary | ICD-10-CM | POA: Diagnosis not present

## 2015-08-16 DIAGNOSIS — F0280 Dementia in other diseases classified elsewhere without behavioral disturbance: Secondary | ICD-10-CM | POA: Diagnosis not present

## 2015-08-16 DIAGNOSIS — E1142 Type 2 diabetes mellitus with diabetic polyneuropathy: Secondary | ICD-10-CM

## 2015-08-16 DIAGNOSIS — N390 Urinary tract infection, site not specified: Secondary | ICD-10-CM | POA: Diagnosis not present

## 2015-08-16 DIAGNOSIS — J45909 Unspecified asthma, uncomplicated: Secondary | ICD-10-CM

## 2015-08-16 DIAGNOSIS — E119 Type 2 diabetes mellitus without complications: Secondary | ICD-10-CM

## 2015-08-16 MED ORDER — VITAMIN C 500 MG PO TABS: 500.0000 mg | ORAL_TABLET | Freq: Every day | ORAL | Status: DC

## 2015-08-16 MED ORDER — CRANBERRY 450 MG PO CAPS
450.0000 mg | ORAL_CAPSULE | Freq: Every day | ORAL | Status: DC
Start: 2015-08-16 — End: 2015-10-18

## 2015-08-16 NOTE — Progress Notes (Signed)
Patient ID: Tamara Perez, female   DOB: 1932/12/20, 81 y.o.   MRN: [id number]    Facility:  [facility]      Allergies  Allergen Reactions  . Codeine Other (See Comments)    Reaction unknown    Chief Complaint  Patient presents with  . Medical Management of Chronic Issues    HPI:  She is a long term resident of this facility being seen for the management of her chronic illnesses. Overall there is little change in her status. She is unable to fully participate in the hpi or ros; but states that she feels good. There are no nursing concerns today.     Past Medical History  Diagnosis Date  . ADENOCARCINOMA, BREAST   . DYSLIPIDEMIA   . DEMENTIA   . DEPRESSION   . ASTHMA   . GAIT DISTURBANCE   . Edema   . URINARY INCONTINENCE   . TRANSIENT ISCHEMIC ATTACK, HX OF   . DIVERTICULITIS, HX OF   . HYPERGLYCEMIA   . Venous stasis dermatitis     R>L LE, chronic  . Type II or unspecified type diabetes mellitus with neurological manifestations, uncontrolled     Qualifier: Diagnosis of  By: Asa Lente MD, Jannifer Rodney Breast cancer [facility])     Past Surgical History  Procedure Laterality Date  . Breast surgery      05/31/04 2 lumps removed, s/p XRT and chemo  . Abdominal hysterectomy    . Breast biopsy  2005  . Urinary sling  2007  . Itp      1991 [facility] [place]    VITAL SIGNS BP 106/60 mmHg  Pulse 67  Ht 4\' 11"  (1.499 m)  Wt 149 lb (67.586 kg)  BMI 30.08 kg/m2  Patient's Medications  New Prescriptions   No medications on file  Previous Medications   ASPIRIN 81 MG TABLET    Take 1 tablet (81 mg total) by mouth daily.   ATORVASTATIN (LIPITOR) 10 MG TABLET    Take 1 tablet (10 mg total) by mouth daily.   BECLOMETHASONE (QVAR) 80 MCG/ACT INHALER    Inhale 2 puffs into the lungs 2 (two) times daily.    CALCIUM CARBONATE-VITAMIN D (CALTRATE 600+D) 600-400 MG-UNIT PER TABLET    Take 1 tablet by mouth daily.   CHOLECALCIFEROL (VITAMIN D3)  1000 UNITS CAPS    Take 2 capsules (2,000 Units total) by mouth daily.   DONEPEZIL (ARICEPT) 10 MG TABLET    Take 1 tablet (10 mg total) by mouth at bedtime.   FLUOCINONIDE CREAM (LIDEX) 0.05 %    Apply topically 2 (two) times daily as needed.   FUROSEMIDE (LASIX) 20 MG TABLET    Take 20 mg by mouth daily.    GABAPENTIN (NEURONTIN) 300 MG CAPSULE    Take 1 capsule (300 mg total) by mouth at bedtime.   MAGNESIUM HYDROXIDE (MILK OF MAGNESIA) 400 MG/5ML SUSPENSION    Take 30 mLs by mouth daily as needed for mild constipation.   MEMANTINE HCL ER (NAMENDA XR) 28 MG CP24    Take 28 mg by mouth daily.   NITROFURANTOIN (MACRODANTIN) 50 MG CAPSULE    Take 1 capsule (50 mg total) by mouth at bedtime.   PAROXETINE (PAXIL) 20 MG TABLET    Take 1 tablet (20 mg total) by mouth every morning.   TRAVOPROST, BENZALKONIUM, (TRAVATAN) 0.004 % OPHTHALMIC SOLUTION    Place 1 drop into both eyes daily. For glaucoma  Modified Medications   No medications on file  Discontinued Medications   No medications on file     SIGNIFICANT DIAGNOSTIC EXAMS   NOT APPROPRIATE FOR DEXA; MAMMOGRAM; COLONOSCOPY   LABS REVIEWED:   10-21-14: wbc 8.2; hgb 13.2; hct 44.5 ;mcv 91.7; plt 227; glucose 214; bun 9.0; creat 0.67; k+4.5; na++142; liver normal albumin 3.4 chol 211; ldl 143; trig 175; hdl 33  01-05-15: urine for micro-albumin 2.8; hgb a1c 8.2  04-10-15: glucose 183; bun 15.7; creat 0.61; k+ 3.7; na++ 142; liver normal albumin 3.4; hgb a1c 7.3; chol 203; ldl 140; trig 194; hdl 24  07-11-15: wbc 10.5; hgb 13.8; hct 46.0; mcv 92.0; plt 222; vit D 37.87     Review of Systems Unable to perform ROS: Dementia    Physical Exam Constitutional: No distress.  Is overweight   Eyes: Conjunctivae are normal.  Neck: Neck supple. No JVD present. No thyromegaly present.  Cardiovascular: Normal rate, regular rhythm and intact distal pulses.   Respiratory: Effort normal and breath sounds normal. No respiratory distress. She has no  wheezes.  GI: Soft. Bowel sounds are normal. She exhibits no distension. There is no tenderness.  Musculoskeletal: She exhibits no edema.  Able to move all extremities   Lymphadenopathy:    She has no cervical adenopathy.  Neurological: She is alert.  Skin: Skin is warm and dry. She is not diaphoretic.  Psychiatric: She has a normal mood and affect.     ASSESSMENT/ PLAN:  1. Dementia: no significant change; will continue aricept 10 mg daily; and namenda xr 28 mg daily; asa 81 mg daily will monitor  Her current weight is 149 pounds; her weight in Sept was 155 pounds.   2. Edema: is stable will continue lasix 20 mg daily   3. Asthma: is stable will continue QVar 2 puffs twice daily will monitor   4. Diabetic polyneuropathy: is stable will continue neurontin 300 mg nightly will monitor  5. UTI: no recent infection present; long term use of abt is no longer indicated; will stop her macrobid at this time; will begin cranberry and vit c daily for supplements. The long term use of abt contributes to increased antibiotic resistance; will monitor her status.   6. Depression: is stable will continue paxil 20 mg daily; she does continue to benefit from this medication;  will monitor   7. Glaucoma: will continue travatan to both eyes daily   8. Diabetes: is presently not on medications; will not make changes will monitor her status; her hgb a1c is 7.3  9. Dyslipidemia: her ldl is 140; trig 194; will continue lipitor 10 mg nightly       Ok Edwards NP [facility]  Contact [phone] Monday through Friday 8am- 5pm  After hours call [phone]

## 2015-09-12 ENCOUNTER — Non-Acute Institutional Stay (SKILLED_NURSING_FACILITY): Payer: Medicare Other | Admitting: Internal Medicine

## 2015-09-12 DIAGNOSIS — E1142 Type 2 diabetes mellitus with diabetic polyneuropathy: Secondary | ICD-10-CM

## 2015-09-12 DIAGNOSIS — F329 Major depressive disorder, single episode, unspecified: Secondary | ICD-10-CM

## 2015-09-12 DIAGNOSIS — F32A Depression, unspecified: Secondary | ICD-10-CM

## 2015-09-12 DIAGNOSIS — E119 Type 2 diabetes mellitus without complications: Secondary | ICD-10-CM

## 2015-09-12 NOTE — Progress Notes (Signed)
MRN: [id number] Name: Tamara Perez  Sex: female Age: 81 y.o. DOB: [birth year]  [place] #: [facility] Facility/Room:224 Level Of Care: SNF Provider: Inocencio Homes D Emergency Contacts: Extended Emergency Contact Information Primary Emergency Contact: [place] Phone: [phone] Work Phone: [phone] Relation: Daughter  Code Status:   Allergies: Codeine  Chief Complaint  Patient presents with  . Medical Management of Chronic Issues    HPI: Patient is 81 y.o. female who is being seen for routine issues of depression, DM2, and diabetic polyneuropathy.  Past Medical History  Diagnosis Date  . ADENOCARCINOMA, BREAST   . DYSLIPIDEMIA   . DEMENTIA   . DEPRESSION   . ASTHMA   . GAIT DISTURBANCE   . Edema   . URINARY INCONTINENCE   . TRANSIENT ISCHEMIC ATTACK, HX OF   . DIVERTICULITIS, HX OF   . HYPERGLYCEMIA   . Venous stasis dermatitis     R>L LE, chronic  . Type II or unspecified type diabetes mellitus with neurological manifestations, uncontrolled     Qualifier: Diagnosis of  By: Asa Lente MD, Jannifer Rodney Breast cancer [facility])     Past Surgical History  Procedure Laterality Date  . Breast surgery      05/31/04 2 lumps removed, s/p XRT and chemo  . Abdominal hysterectomy    . Breast biopsy  2005  . Urinary sling  2007  . Itp      1991 [facility] [place]      Medication List       This list is accurate as of: 09/12/15 11:59 PM.  Always use your most recent med list.               aspirin 81 MG tablet  Take 1 tablet (81 mg total) by mouth daily.     atorvastatin 10 MG tablet  Commonly known as:  LIPITOR  Take 1 tablet (10 mg total) by mouth daily.     beclomethasone 80 MCG/ACT inhaler  Commonly known as:  QVAR  Inhale 2 puffs into the lungs 2 (two) times daily.     Calcium Carbonate-Vitamin D 600-400 MG-UNIT tablet  Commonly known as:  CALTRATE 600+D  Take 1 tablet by mouth daily.      Cranberry 450 MG Caps  Take 450 mg by mouth daily.     donepezil 10 MG tablet  Commonly known as:  ARICEPT  Take 1 tablet (10 mg total) by mouth at bedtime.     fluocinonide cream 0.05 %  Commonly known as:  LIDEX  Apply topically 2 (two) times daily as needed.     furosemide 20 MG tablet  Commonly known as:  LASIX  Take 20 mg by mouth daily.     gabapentin 300 MG capsule  Commonly known as:  NEURONTIN  Take 1 capsule (300 mg total) by mouth at bedtime.     magnesium hydroxide 400 MG/5ML suspension  Commonly known as:  MILK OF MAGNESIA  Take 30 mLs by mouth daily as needed for mild constipation.     NAMENDA XR 28 MG Cp24 24 hr capsule  Generic drug:  memantine  Take 28 mg by mouth daily.     PARoxetine 20 MG tablet  Commonly known as:  PAXIL  Take 1 tablet (20 mg total) by mouth every morning.     travoprost (benzalkonium) 0.004 % ophthalmic solution  Commonly known as:  TRAVATAN  Place 1 drop into both eyes daily.  For glaucoma     vitamin C 500 MG tablet  Commonly known as:  ASCORBIC ACID  Take 1 tablet (500 mg total) by mouth daily.     Vitamin D3 1000 units Caps  Take 2 capsules (2,000 Units total) by mouth daily.        No orders of the defined types were placed in this encounter.    Immunization History  Administered Date(s) Administered  . Influenza Split 06/19/2011  . Influenza Whole 06/16/2009, 06/28/2013  . Influenza-Unspecified 06/27/2014  . PPD Test 10/17/2012  . Pneumococcal Polysaccharide-23 09/17/2007  . Td 12/29/2008    Social History  Substance Use Topics  . Smoking status: Never Smoker   . Smokeless tobacco: Not on file     Comment: lives with spouse at [facility]  . Alcohol Use: No    Review of Systems UTO reliable 2/2 dementia, nursing without concerns     Filed Vitals:   09/17/15 1320  BP: 126/68  Pulse: 70  Temp: 97 F (36.1 C)  Resp: 19    Physical Exam  GENERAL APPEARANCE: Alert, conversant, No  acute distress  SKIN: No diaphoresis rash HEENT: Unremarkable RESPIRATORY: Breathing is even, unlabored. Lung sounds are clear   CARDIOVASCULAR: Heart RRR no murmurs, rubs or gallops. Trace peripheral edema  GASTROINTESTINAL: Abdomen is soft, non-tender, not distended w/ normal bowel sounds.  GENITOURINARY: Bladder non tender, not distended  MUSCULOSKELETAL: No abnormal joints or musculature NEUROLOGIC: Cranial nerves 2-12 grossly intact. Moves all extremities PSYCHIATRIC: Mood and affect appropriate to situation with dementia, no behavioral issues  Patient Active Problem List   Diagnosis Date Noted  . Dyslipidemia associated with type 2 diabetes mellitus ([place]) 08/16/2015  . Vitamin D deficiency 07/10/2015  . Diabetes mellitus type II, controlled ([place]) 04/25/2015  . Glaucoma 10/23/2014  . Essential hypertension, benign 10/23/2014  . Alzheimer's disease 10/23/2014  . Chronic UTI 02/26/2013  . Diabetic polyneuropathy ([place]) 02/26/2013  . Depression 12/29/2008  . Asthma 12/29/2008    CBC    Component Value Date/Time   WBC 9.4 04/07/2014   WBC 8.3 06/22/2012 1402   WBC 7.5 01/31/2011 1409   RBC 4.90 06/22/2012 1402   RBC 4.46 01/31/2011 1409   HGB 13.6 04/07/2014   HGB 13.7 01/31/2011 1409   HCT 45 04/07/2014   HCT 40.9 01/31/2011 1409   PLT 238 04/07/2014   PLT 221 01/31/2011 1409   MCV 93.4 06/22/2012 1402   MCV 91.7 01/31/2011 1409   LYMPHSABS 1.7 06/22/2012 1402   LYMPHSABS 1.5 01/31/2011 1409   MONOABS 0.6 06/22/2012 1402   MONOABS 0.7 01/31/2011 1409   EOSABS 0.2 06/22/2012 1402   EOSABS 0.2 01/31/2011 1409   BASOSABS 0.1 06/22/2012 1402   BASOSABS 0.0 01/31/2011 1409    CMP     Component Value Date/Time   NA 145 04/07/2014   NA 135 06/22/2012 1402   K 5.0 04/07/2014   CL 96 06/22/2012 1402   CO2 30 06/22/2012 1402   GLUCOSE 129* 06/22/2012 1402   BUN 16 04/07/2014   BUN 6 06/22/2012 1402   CREATININE 0.6 04/07/2014   CREATININE 0.7 06/22/2012 1402    CALCIUM 9.5 06/22/2012 1402   PROT 7.4 11/15/2011 1351   ALBUMIN 3.4* 11/15/2011 1351   AST 18 04/07/2014   ALT 17 04/07/2014   ALKPHOS 103 11/15/2011 1351   BILITOT 0.3 11/15/2011 1351   GFRNONAA 81* 11/15/2011 1351   GFRAA >90 11/15/2011 1351    Assessment and Plan  Depression Chronic and stable will continue paxil 20 mg daily; she does continue to benefit from this medication; will monitor    Diabetes mellitus type II, controlled Presently not on medications; will not make changes will monitor her status; her hgb a1c is 7.3; pt is on statin   Diabetic polyneuropathy (HCC) Chronic and stable will continue neurontin 300 mg nightly will monitor     Hennie Duos, MD

## 2015-09-17 ENCOUNTER — Encounter: Payer: Self-pay | Admitting: Internal Medicine

## 2015-09-17 NOTE — Assessment & Plan Note (Signed)
Presently not on medications; will not make changes will monitor her status; her hgb a1c is 7.3; pt is on statin

## 2015-09-17 NOTE — Assessment & Plan Note (Signed)
Chronic and stable will continue neurontin 300 mg nightly will monitor

## 2015-09-17 NOTE — Assessment & Plan Note (Signed)
Chronic and stable will continue paxil 20 mg daily; she does continue to benefit from this medication; will monitor

## 2015-09-23 ENCOUNTER — Encounter: Payer: Self-pay | Admitting: Internal Medicine

## 2015-10-14 ENCOUNTER — Encounter (HOSPITAL_COMMUNITY): Payer: Self-pay | Admitting: Emergency Medicine

## 2015-10-14 ENCOUNTER — Inpatient Hospital Stay (HOSPITAL_COMMUNITY)
Admission: EM | Admit: 2015-10-14 | Discharge: 2015-10-18 | DRG: 872 | Disposition: A | Discharge status: Hospice | Payer: Medicare Other | Source: Skilled Nursing Facility | Attending: Internal Medicine | Admitting: Internal Medicine

## 2015-10-14 ENCOUNTER — Emergency Department (HOSPITAL_COMMUNITY): Payer: Medicare Other

## 2015-10-14 DIAGNOSIS — E785 Hyperlipidemia, unspecified: Secondary | ICD-10-CM | POA: Diagnosis present

## 2015-10-14 DIAGNOSIS — Z7401 Bed confinement status: Secondary | ICD-10-CM | POA: Diagnosis not present

## 2015-10-14 DIAGNOSIS — F329 Major depressive disorder, single episode, unspecified: Secondary | ICD-10-CM | POA: Diagnosis present

## 2015-10-14 DIAGNOSIS — N390 Urinary tract infection, site not specified: Secondary | ICD-10-CM | POA: Insufficient documentation

## 2015-10-14 DIAGNOSIS — Z66 Do not resuscitate: Secondary | ICD-10-CM | POA: Diagnosis present

## 2015-10-14 DIAGNOSIS — R6520 Severe sepsis without septic shock: Secondary | ICD-10-CM | POA: Diagnosis present

## 2015-10-14 DIAGNOSIS — I5032 Chronic diastolic (congestive) heart failure: Secondary | ICD-10-CM | POA: Diagnosis present

## 2015-10-14 DIAGNOSIS — A419 Sepsis, unspecified organism: Principal | ICD-10-CM | POA: Diagnosis present

## 2015-10-14 DIAGNOSIS — E872 Acidosis: Secondary | ICD-10-CM | POA: Diagnosis not present

## 2015-10-14 DIAGNOSIS — Z886 Allergy status to analgesic agent status: Secondary | ICD-10-CM | POA: Diagnosis not present

## 2015-10-14 DIAGNOSIS — Z6829 Body mass index (BMI) 29.0-29.9, adult: Secondary | ICD-10-CM

## 2015-10-14 DIAGNOSIS — I248 Other forms of acute ischemic heart disease: Secondary | ICD-10-CM | POA: Diagnosis present

## 2015-10-14 DIAGNOSIS — D751 Secondary polycythemia: Secondary | ICD-10-CM | POA: Diagnosis present

## 2015-10-14 DIAGNOSIS — N179 Acute kidney failure, unspecified: Secondary | ICD-10-CM | POA: Insufficient documentation

## 2015-10-14 DIAGNOSIS — E1140 Type 2 diabetes mellitus with diabetic neuropathy, unspecified: Secondary | ICD-10-CM | POA: Diagnosis present

## 2015-10-14 DIAGNOSIS — J45909 Unspecified asthma, uncomplicated: Secondary | ICD-10-CM | POA: Diagnosis present

## 2015-10-14 DIAGNOSIS — Z515 Encounter for palliative care: Secondary | ICD-10-CM | POA: Insufficient documentation

## 2015-10-14 DIAGNOSIS — F0280 Dementia in other diseases classified elsewhere without behavioral disturbance: Secondary | ICD-10-CM | POA: Diagnosis present

## 2015-10-14 DIAGNOSIS — D72829 Elevated white blood cell count, unspecified: Secondary | ICD-10-CM

## 2015-10-14 DIAGNOSIS — N10 Acute tubulo-interstitial nephritis: Secondary | ICD-10-CM

## 2015-10-14 DIAGNOSIS — R32 Unspecified urinary incontinence: Secondary | ICD-10-CM | POA: Diagnosis present

## 2015-10-14 DIAGNOSIS — E119 Type 2 diabetes mellitus without complications: Secondary | ICD-10-CM

## 2015-10-14 DIAGNOSIS — B964 Proteus (mirabilis) (morganii) as the cause of diseases classified elsewhere: Secondary | ICD-10-CM | POA: Diagnosis present

## 2015-10-14 DIAGNOSIS — E870 Hyperosmolality and hypernatremia: Secondary | ICD-10-CM | POA: Insufficient documentation

## 2015-10-14 DIAGNOSIS — Z7982 Long term (current) use of aspirin: Secondary | ICD-10-CM

## 2015-10-14 DIAGNOSIS — G309 Alzheimer's disease, unspecified: Secondary | ICD-10-CM | POA: Diagnosis present

## 2015-10-14 DIAGNOSIS — I10 Essential (primary) hypertension: Secondary | ICD-10-CM | POA: Diagnosis present

## 2015-10-14 DIAGNOSIS — Z853 Personal history of malignant neoplasm of breast: Secondary | ICD-10-CM

## 2015-10-14 DIAGNOSIS — E1149 Type 2 diabetes mellitus with other diabetic neurological complication: Secondary | ICD-10-CM | POA: Diagnosis present

## 2015-10-14 DIAGNOSIS — R64 Cachexia: Secondary | ICD-10-CM | POA: Diagnosis present

## 2015-10-14 DIAGNOSIS — R0902 Hypoxemia: Secondary | ICD-10-CM | POA: Diagnosis present

## 2015-10-14 DIAGNOSIS — J449 Chronic obstructive pulmonary disease, unspecified: Secondary | ICD-10-CM | POA: Diagnosis present

## 2015-10-14 DIAGNOSIS — R7401 Elevation of levels of liver transaminase levels: Secondary | ICD-10-CM

## 2015-10-14 DIAGNOSIS — R4182 Altered mental status, unspecified: Secondary | ICD-10-CM | POA: Diagnosis not present

## 2015-10-14 DIAGNOSIS — E860 Dehydration: Secondary | ICD-10-CM | POA: Diagnosis present

## 2015-10-14 DIAGNOSIS — R569 Unspecified convulsions: Secondary | ICD-10-CM

## 2015-10-14 DIAGNOSIS — R740 Nonspecific elevation of levels of transaminase and lactic acid dehydrogenase [LDH]: Secondary | ICD-10-CM

## 2015-10-14 DIAGNOSIS — G308 Other Alzheimer's disease: Secondary | ICD-10-CM | POA: Diagnosis not present

## 2015-10-14 DIAGNOSIS — F0281 Dementia in other diseases classified elsewhere with behavioral disturbance: Secondary | ICD-10-CM | POA: Diagnosis not present

## 2015-10-14 DIAGNOSIS — R7989 Other specified abnormal findings of blood chemistry: Secondary | ICD-10-CM | POA: Insufficient documentation

## 2015-10-14 LAB — COMPREHENSIVE METABOLIC PANEL
ALT: 53 U/L (ref 14–54)
AST: 103 U/L — AB (ref 15–41)
Albumin: 3 g/dL — ABNORMAL LOW (ref 3.5–5.0)
Alkaline Phosphatase: 74 U/L (ref 38–126)
Anion gap: 19 — ABNORMAL HIGH (ref 5–15)
BUN: 79 mg/dL — AB (ref 6–20)
CHLORIDE: 119 mmol/L — AB (ref 101–111)
CO2: 24 mmol/L (ref 22–32)
CREATININE: 2.39 mg/dL — AB (ref 0.44–1.00)
Calcium: 10.3 mg/dL (ref 8.9–10.3)
GFR, EST AFRICAN AMERICAN: 21 mL/min — AB (ref >60)
GFR, EST NON AFRICAN AMERICAN: 18 mL/min — AB (ref >60)
Glucose, Bld: 278 mg/dL — ABNORMAL HIGH (ref 65–99)
POTASSIUM: 4.8 mmol/L (ref 3.5–5.1)
SODIUM: 162 mmol/L — AB (ref 135–145)
Total Bilirubin: 1.3 mg/dL — ABNORMAL HIGH (ref 0.3–1.2)
Total Protein: 8.7 g/dL — ABNORMAL HIGH (ref 6.5–8.1)

## 2015-10-14 LAB — APTT: APTT: 24 s (ref 24–37)

## 2015-10-14 LAB — CBC WITH DIFFERENTIAL/PLATELET
BASOS PCT: 0 %
Basophils Absolute: 0 10*3/uL (ref 0.0–0.1)
EOS PCT: 0 %
Eosinophils Absolute: 0 10*3/uL (ref 0.0–0.7)
HEMATOCRIT: 56.8 % — AB (ref 36.0–46.0)
HEMOGLOBIN: 17.9 g/dL — AB (ref 12.0–15.0)
Lymphocytes Relative: 14 %
Lymphs Abs: 2.7 10*3/uL (ref 0.7–4.0)
MCH: 29.6 pg (ref 26.0–34.0)
MCHC: 31.5 g/dL (ref 30.0–36.0)
MCV: 93.9 fL (ref 78.0–100.0)
MONO ABS: 1.9 10*3/uL — AB (ref 0.1–1.0)
MONOS PCT: 10 %
NEUTROS PCT: 76 %
Neutro Abs: 14.8 10*3/uL — ABNORMAL HIGH (ref 1.7–7.7)
Platelets: 321 10*3/uL (ref 150–400)
RBC: 6.05 MIL/uL — AB (ref 3.87–5.11)
RDW: 15.5 % (ref 11.5–15.5)
WBC: 19.4 10*3/uL — AB (ref 4.0–10.5)

## 2015-10-14 LAB — MRSA PCR SCREENING: MRSA BY PCR: NEGATIVE

## 2015-10-14 LAB — URINE MICROSCOPIC-ADD ON

## 2015-10-14 LAB — PROTIME-INR
INR: 1.56 — ABNORMAL HIGH (ref 0.00–1.49)
Prothrombin Time: 18.7 seconds — ABNORMAL HIGH (ref 11.6–15.2)

## 2015-10-14 LAB — URINALYSIS, ROUTINE W REFLEX MICROSCOPIC
Bilirubin Urine: NEGATIVE
GLUCOSE, UA: NEGATIVE mg/dL
Ketones, ur: 15 mg/dL — AB
NITRITE: NEGATIVE
PROTEIN: 100 mg/dL — AB
SPECIFIC GRAVITY, URINE: 1.016 (ref 1.005–1.030)
pH: 8 (ref 5.0–8.0)

## 2015-10-14 LAB — I-STAT CG4 LACTIC ACID, ED
Lactic Acid, Venous: 3.09 mmol/L (ref 0.5–2.0)
Lactic Acid, Venous: 2.61 mmol/L (ref 0.5–2.0)

## 2015-10-14 LAB — TROPONIN I
TROPONIN I: 0.13 ng/mL — AB (ref <0.031)
TROPONIN I: 0.11 ng/mL — AB (ref <0.031)
Troponin I: 0.1 ng/mL — ABNORMAL HIGH (ref <0.031)

## 2015-10-14 LAB — LACTIC ACID, PLASMA
LACTIC ACID, VENOUS: 2.3 mmol/L — AB (ref 0.5–2.0)
Lactic Acid, Venous: 1.9 mmol/L (ref 0.5–2.0)

## 2015-10-14 LAB — MAGNESIUM: MAGNESIUM: 2 mg/dL (ref 1.7–2.4)

## 2015-10-14 LAB — PROCALCITONIN: Procalcitonin: 0.22 ng/mL

## 2015-10-14 LAB — PHOSPHORUS: Phosphorus: 4.4 mg/dL (ref 2.5–4.6)

## 2015-10-14 LAB — CBG MONITORING, ED: Glucose-Capillary: 330 mg/dL — ABNORMAL HIGH (ref 65–99)

## 2015-10-14 LAB — GLUCOSE, CAPILLARY: Glucose-Capillary: 204 mg/dL — ABNORMAL HIGH (ref 65–99)

## 2015-10-14 MED ORDER — SODIUM CHLORIDE 0.45 % IV SOLN: INTRAVENOUS | Status: DC

## 2015-10-14 MED ORDER — ACETAMINOPHEN 325 MG PO TABS: 650.0000 mg | ORAL_TABLET | Freq: Four times a day (QID) | ORAL | Status: DC | PRN

## 2015-10-14 MED ORDER — PAROXETINE HCL 20 MG PO TABS: 20.0000 mg | ORAL_TABLET | Freq: Every morning | ORAL | Status: DC

## 2015-10-14 MED ORDER — SODIUM CHLORIDE 0.9 % IV BOLUS (SEPSIS)
1000.0000 mL | INTRAVENOUS | Status: AC
  Administered 2015-10-14 (×2): 1000 mL via INTRAVENOUS

## 2015-10-14 MED ORDER — PIPERACILLIN-TAZOBACTAM 3.375 G IVPB 30 MIN
3.3750 g | Freq: Once | INTRAVENOUS | Status: AC
  Administered 2015-10-14: 3.375 g via INTRAVENOUS
  Filled 2015-10-14: qty 50

## 2015-10-14 MED ORDER — LATANOPROST 0.005 % OP SOLN
1.0000 [drp] | Freq: Every day | OPHTHALMIC | Status: DC
  Administered 2015-10-15 – 2015-10-17 (×4): 1 [drp] via OPHTHALMIC
  Filled 2015-10-14 (×2): qty 2.5

## 2015-10-14 MED ORDER — ATORVASTATIN CALCIUM 10 MG PO TABS: 10.0000 mg | ORAL_TABLET | Freq: Every day | ORAL | Status: DC

## 2015-10-14 MED ORDER — METOPROLOL TARTRATE 1 MG/ML IV SOLN: 2.5000 mg | Freq: Four times a day (QID) | INTRAVENOUS | Status: DC | PRN

## 2015-10-14 MED ORDER — ACETAMINOPHEN 650 MG RE SUPP
650.0000 mg | Freq: Four times a day (QID) | RECTAL | Status: DC | PRN
  Administered 2015-10-14: 650 mg via RECTAL
  Filled 2015-10-14: qty 1

## 2015-10-14 MED ORDER — DEXTROSE 5 % IV SOLN
1.0000 g | INTRAVENOUS | Status: DC
  Administered 2015-10-14 – 2015-10-15 (×2): 1 g via INTRAVENOUS
  Filled 2015-10-14 (×3): qty 1

## 2015-10-14 MED ORDER — SODIUM CHLORIDE 0.9 % IV BOLUS (SEPSIS)
500.0000 mL | INTRAVENOUS | Status: AC
  Administered 2015-10-14: 500 mL via INTRAVENOUS

## 2015-10-14 MED ORDER — GABAPENTIN 300 MG PO CAPS: 300.0000 mg | ORAL_CAPSULE | Freq: Every day | ORAL | Status: DC

## 2015-10-14 MED ORDER — MEMANTINE HCL ER 28 MG PO CP24
28.0000 mg | ORAL_CAPSULE | Freq: Every day | ORAL | Status: DC
  Filled 2015-10-14: qty 1

## 2015-10-14 MED ORDER — ASPIRIN 300 MG RE SUPP
300.0000 mg | Freq: Every day | RECTAL | Status: DC
  Administered 2015-10-14 – 2015-10-15 (×2): 300 mg via RECTAL
  Filled 2015-10-14 (×2): qty 1

## 2015-10-14 MED ORDER — ASPIRIN 81 MG PO TABS
81.0000 mg | ORAL_TABLET | Freq: Every day | ORAL | Status: DC
  Filled 2015-10-14: qty 1

## 2015-10-14 MED ORDER — VANCOMYCIN HCL IN DEXTROSE 1-5 GM/200ML-% IV SOLN
1000.0000 mg | Freq: Once | INTRAVENOUS | Status: AC
  Administered 2015-10-14: 1000 mg via INTRAVENOUS
  Filled 2015-10-14: qty 200

## 2015-10-14 MED ORDER — SODIUM CHLORIDE 0.9 % IV SOLN
INTRAVENOUS | Status: DC
  Administered 2015-10-14 (×2): via INTRAVENOUS

## 2015-10-14 MED ORDER — ONDANSETRON HCL 4 MG PO TABS
4.0000 mg | ORAL_TABLET | Freq: Four times a day (QID) | ORAL | Status: DC | PRN
  Administered 2015-10-17: 4 mg via ORAL
  Filled 2015-10-14: qty 1

## 2015-10-14 MED ORDER — SODIUM CHLORIDE 0.9% FLUSH
3.0000 mL | Freq: Two times a day (BID) | INTRAVENOUS | Status: DC
  Administered 2015-10-14 – 2015-10-15 (×4): 3 mL via INTRAVENOUS

## 2015-10-14 MED ORDER — BUDESONIDE 0.25 MG/2ML IN SUSP
0.2500 mg | Freq: Two times a day (BID) | RESPIRATORY_TRACT | Status: DC
  Administered 2015-10-14 – 2015-10-18 (×9): 0.25 mg via RESPIRATORY_TRACT
  Filled 2015-10-14 (×10): qty 2

## 2015-10-14 MED ORDER — SODIUM CHLORIDE 0.9 % IV BOLUS (SEPSIS): 1000.0000 mL | INTRAVENOUS | Status: AC

## 2015-10-14 MED ORDER — ONDANSETRON HCL 4 MG/2ML IJ SOLN
4.0000 mg | Freq: Four times a day (QID) | INTRAMUSCULAR | Status: DC | PRN
Start: 2015-10-14 — End: 2015-10-18

## 2015-10-14 MED ORDER — SODIUM CHLORIDE 0.9 % IV SOLN
500.0000 mg | Freq: Two times a day (BID) | INTRAVENOUS | Status: DC
  Administered 2015-10-14 – 2015-10-16 (×3): 500 mg via INTRAVENOUS
  Filled 2015-10-14 (×4): qty 5

## 2015-10-14 MED ORDER — SODIUM CHLORIDE 0.9 % IV BOLUS (SEPSIS): 500.0000 mL | INTRAVENOUS | Status: AC

## 2015-10-14 MED ORDER — DONEPEZIL HCL 10 MG PO TABS: 10.0000 mg | ORAL_TABLET | Freq: Every day | ORAL | Status: DC

## 2015-10-14 MED ORDER — ACETAMINOPHEN 650 MG RE SUPP
650.0000 mg | Freq: Once | RECTAL | Status: AC
  Administered 2015-10-14: 650 mg via RECTAL
  Filled 2015-10-14: qty 1

## 2015-10-14 MED ORDER — ENOXAPARIN SODIUM 30 MG/0.3ML ~~LOC~~ SOLN
30.0000 mg | SUBCUTANEOUS | Status: DC
  Administered 2015-10-14 – 2015-10-15 (×2): 30 mg via SUBCUTANEOUS
  Filled 2015-10-14 (×2): qty 0.3

## 2015-10-14 NOTE — ED Notes (Signed)
Admitting Md at the bedside, talking with the pt.

## 2015-10-14 NOTE — ED Notes (Signed)
Dr. Roxanne Mins updated family on pt.s plan of care .

## 2015-10-14 NOTE — ED Notes (Signed)
Lab called with critical result [id number].

## 2015-10-14 NOTE — ED Notes (Signed)
Per EMS, pt coming in from [facility] due to being unresponsive since at least 830 last night.. Pts BP:117/72, HR:130, SpO2: 96% on 4L O2, RR:40. Pt arrives to ED completely unresponsive. MD at bedside.

## 2015-10-14 NOTE — H&P (Signed)
[facility] History and Physical  Tamara Perez [id number] DOB: 1932/10/28 DOA: 10/14/2015  Referring physician:  PCP: Hennie Duos, MD   Chief Complaint: Marchia Bond  HPI: Tamara Perez is a 82 y.o. female with a history of Alzheimer's dementia, brought from her nursing home to the emergency department after found unresponsive at the facility. The history is provided by her family. She was brought early this morning with fevers initially at the range of 100.3, Tachycardia, hypoxia requiring up to 4 L of oxygen on arrival. Family states she had several UTIs in the past, the last one about 8 or 9 months ago, treated with oral antibiotics.She had a urinary sling placed in 2007 for incontinence. The patient is incontinent of stools and urine, requiring diapers. Family did notice that her oral intake and urinary output was decreased over the last 3-4 days prior to admission, she required only one diaper change. Family reports patient feeling weaker over the last few days, but without shortness of breath or wheezing. No cough or sputum production. No hemoptysis. No apparent nausea, vomiting, or diarrhea. No sick contacts.  At the ED  UA was positive for TNC WBC, turbid urine. CXR was negative.  Max temp was 103 F, lactic acid was at 2.61, heart rate up to 130, and blood pressure borderline in the low 100s, lactate 3.09. She received received 2 L of fluid bolus in the emergency department and antibiotics initiated with some improvement of her BP and Temp and O2 sats.  She was found to have AKI as well, with Sodium 162, creatinine up to 2.39 and be you when of 81. She has mild transamminitis, with AST of 103 and AST of 53, bilirubin 1.3 . CBC was remarkable for leukocytosis, with a white count of 19 and hemoglobin of 17.9 in the setting of dehydration.   Review of Systems: Unable to obtain from patient, familly at bedside Constitutional:  Positive for weight loss of unknown amount due to  decreased oral intake due to decreased appetite, positive night sweats, positive fevers up to 103 this morning, negative for chills.    HEENT:  No headaches, Difficulty swallowing,Tooth/dental problems,Sore throat, No sneezing, itching, ear ache, nasal congestion, post nasal drip,  Cardio-vascular:  No chest pain, Orthopnea, PND, swelling in lower extremities, anasarca, dizziness, palpitations  GI:  No heartburn, indigestion, abdominal pain, nausea, vomiting. She has chronic bowel incontinence. positive loss of appetite  Resp:  No shortness of breath at rest. She is wheelchair bound, unknown respiratory status on exertion. No excess mucus, no productive cough, No non-productive cough, No coughing up of blood. No change in color of mucus. No wheezing. No chest wall deformity  Skin:  no rash or lesions. She has a history of venous stasis GU:  The patient is incontinent of urine, wears a diaper, per family report, she has had decreased urine output over the last week. No  flank pain reported.  Musculoskeletal:  No joint pain or swelling. No decreased range of motion. No back pain.  Psych:  No change in mood or affect. She has a history of depression. She has memory loss due to Alzheimer's dementia  Past Medical History  Diagnosis Date  . ADENOCARCINOMA, BREAST   . DYSLIPIDEMIA   . DEMENTIA   . DEPRESSION   . ASTHMA   . GAIT DISTURBANCE   . Edema   . URINARY INCONTINENCE   . TRANSIENT ISCHEMIC ATTACK, HX OF   . DIVERTICULITIS, HX OF   .  HYPERGLYCEMIA   . Venous stasis dermatitis     R>L LE, chronic  . Type II or unspecified type diabetes mellitus with neurological manifestations, uncontrolled     Qualifier: Diagnosis of  By: Asa Lente MD, Jannifer Rodney Breast cancer [facility])    Past Surgical History  Procedure Laterality Date  . Breast surgery      05/31/04 2 lumps removed, s/p XRT and chemo  . Abdominal hysterectomy    . Breast biopsy  2005  . Urinary sling  2007  . Itp      1991  [facility] [place]   Social History:  reports that she has never smoked. She does not have any smokeless tobacco history on file. She reports that she does not drink alcohol or use illicit drugs. LIves in nursing home.   Allergies  Allergen Reactions  . Codeine Other (See Comments)    Reaction unknown  . Tuberculin Tests     Family History  Problem Relation Age of Onset  . Breast cancer Mother   . Kidney disease Mother   . Stroke Mother   . Prostate cancer Father   . Heart disease Father     Prior to Admission medications   Medication Sig Start Date End Date Taking? Authorizing Provider  aspirin 81 MG tablet Take 1 tablet (81 mg total) by mouth daily. 08/05/12  Yes Rowe Clack, MD  atorvastatin (LIPITOR) 10 MG tablet Take 1 tablet (10 mg total) by mouth daily. 04/25/15  Yes Gerlene Fee, NP  beclomethasone (QVAR) 80 MCG/ACT inhaler Inhale 2 puffs into the lungs 2 (two) times daily.    Yes Historical Provider, MD  Calcium Carbonate-Vitamin D (CALTRATE 600+D) 600-400 MG-UNIT per tablet Take 1 tablet by mouth daily. 07/20/12  Yes Rowe Clack, MD  Cholecalciferol (VITAMIN D3) 1000 UNITS CAPS Take 2 capsules (2,000 Units total) by mouth daily. 08/24/12  Yes Rowe Clack, MD  Cranberry 450 MG CAPS Take 450 mg by mouth daily. 08/16/15  Yes Gerlene Fee, NP  donepezil (ARICEPT) 10 MG tablet Take 1 tablet (10 mg total) by mouth at bedtime. 07/02/12  Yes Rowe Clack, MD  furosemide (LASIX) 20 MG tablet Take 20 mg by mouth daily.    Yes Historical Provider, MD  gabapentin (NEURONTIN) 300 MG capsule Take 1 capsule (300 mg total) by mouth at bedtime. 09/30/12  Yes Rowe Clack, MD  Memantine HCl ER (NAMENDA XR) 28 MG CP24 Take 28 mg by mouth daily.   Yes Historical Provider, MD  PARoxetine (PAXIL) 20 MG tablet Take 1 tablet (20 mg total) by mouth every morning. 07/20/12  Yes Rowe Clack, MD  Travoprost, BAK Free, (TRAVATAN)  0.004 % SOLN ophthalmic solution Place 1 drop into both eyes at bedtime.   Yes Historical Provider, MD  vitamin C (ASCORBIC ACID) 500 MG tablet Take 1 tablet (500 mg total) by mouth daily. 08/16/15  Yes Gerlene Fee, NP   Physical Exam: Filed Vitals:   10/14/15 0645 10/14/15 0700 10/14/15 0715 10/14/15 0730  BP: 112/64 103/62 110/62 123/60  Pulse: 117 112 106 107  Temp:      TempSrc:      Resp: 34 35 43 32  Weight:      SpO2: 98% 99% 99% 99%    Wt Readings from Last 3 Encounters:  10/14/15 67 kg (147 lb 11.3 oz)  08/16/15 67.586 kg (149 lb)  05/25/15 70.308 kg (155 lb)  General:  Appears calm and comfortable. Non interactive at this time Eyes: PERRL, normal lids, irises & conjunctiva ENT: grossly normal hearing, lips & tongue Neck: no LAD, masses or thyromegaly Cardiovascular: RRR, no m/r/g. No LE edema. Telemetry: SR, no arrhythmias  Respiratory: CTA bilaterally anteriorly, no w/r/r. Normal respiratory effort. Abdomen: soft, non tender, positive bowel sounds Skin: no rash or induration seen on limited exam. Well healed left mastectomy site Musculoskeletal: grossly normal tone BUE/BLE Psychiatric: Patient has Alzheimer's dementia, unable to interact at this time to assess mood  Neurologic: Patient has decreased level of consciusnes due to sepsis, unable to follow commands. Reacts to voice, touch.           Labs on Admission:  Basic Metabolic Panel:  Recent Labs Lab 10/14/15 0557  NA 162*  K 4.8  CL 119*  CO2 24  GLUCOSE 278*  BUN 79*  CREATININE 2.39*  CALCIUM 10.3   Liver Function Tests:  Recent Labs Lab 10/14/15 0557  AST 103*  ALT 53  ALKPHOS 74  BILITOT 1.3*  PROT 8.7*  ALBUMIN 3.0*   CBC:  Recent Labs Lab 10/14/15 0557  WBC 19.4*  NEUTROABS 14.8*  HGB 17.9*  HCT 56.8*  MCV 93.9  PLT 321    CBG:  Recent Labs Lab 10/14/15 0708  GLUCAP 330*    Radiological Exams on Admission: Dg Chest Port 1 View  10/14/2015  CLINICAL DATA:   Code sepsis. Acute onset of fever. Initial encounter. EXAM: PORTABLE CHEST 1 VIEW COMPARISON:  Chest radiograph performed 11/29/2011 FINDINGS: The lungs are well-aerated. Mild left midlung atelectasis or scarring is noted. There is no evidence of pleural effusion or pneumothorax. The cardiomediastinal silhouette is borderline normal in size. No acute osseous abnormalities are seen. IMPRESSION: Mild left midlung atelectasis or scarring noted. Lungs otherwise clear. Electronically Signed   By: Garald Balding M.D.   On: 10/14/2015 06:16   EKG Interpretation Saturday October 14 2015 06:32:18 EST Text Interpretation: Sinus tachycardia Abnormal R-wave progression, latetransition Inferior infarct, old Nonspecific ST and T wave abnormality    Urinalysis    Component Value Date/Time   COLORURINE YELLOW* 10/14/2015 0550   APPEARANCEUR TURBID* 10/14/2015 0550   LABSPEC 1.016 10/14/2015 0550   PHURINE 8.0 10/14/2015 0550   GLUCOSEU NEGATIVE 10/14/2015 0550   GLUCOSEU NEGATIVE 12/29/2008 0000   HGBUR LARGE* 10/14/2015 0550   BILIRUBINUR NEGATIVE 10/14/2015 0550   KETONESUR 15* 10/14/2015 0550   PROTEINUR 100* 10/14/2015 0550   UROBILINOGEN 0.2 11/15/2011 1409   NITRITE NEGATIVE 10/14/2015 0550   LEUKOCYTESUR LARGE* 10/14/2015 0550   Assessment/ Plan  Sepsis  Likely related to UTI Admit to step down Rocephin per pharmacy Follow lactic acid Follow blood and urine cultures IV fluids Oxygen supplementation Procalcitonin Monitor closely   Diabetes, Diet Controlled Sliding scale insulin Hold oral agents for now  Peripheral Neuropathy  Likely due to DM Continue Neurontin  Acute Kidney Injury secondary to sepsis Continue aggressive hydration Monitor intake and output Check CMET the morning  Presumed diastolic heart failure Will hold his home Lasix due to sepsis Monitor intake and output  Depression In the setting of Alzheimer's dementia Continue Paxil and  Aricept  COPD/Asthma Continue inhalers  Dyslipidemia Continue Lipitor  Abnormal EKG EKG on 1/28 suggestive of old inferior abnormalities Cycle troponins   Code Status:  DNR DVT Prophylaxis: On Lovenox per pharmacy Family Communication:  Family Disposition Plan: stepdown  . [facility] Pager [phone]

## 2015-10-14 NOTE — Progress Notes (Signed)
Daughter at bedside.

## 2015-10-14 NOTE — ED Provider Notes (Signed)
CSN: [id number]     Arrival date & time 10/14/15  0537 History   First MD Initiated Contact with Patient 10/14/15 0541     Chief Complaint  Patient presents with  . Altered Mental Status     (Consider location/radiation/quality/duration/timing/severity/associated sxs/prior Treatment) Patient is a 82 y.o. female presenting with altered mental status. The history is provided by the EMS personnel and the nursing home. The history is limited by the condition of the patient (Unresponsive).  Altered Mental Status She was transferred here from nursing home where she was noted to be poorly responsive and with low-grade fever of 100.3. EMS reports patient was tachycardic and poorly responsive. She has required 4 L of oxygen to maintain adequate oxygen saturation. She is DO NOT RESUSCITATE.  Past Medical History  Diagnosis Date  . ADENOCARCINOMA, BREAST   . DYSLIPIDEMIA   . DEMENTIA   . DEPRESSION   . ASTHMA   . GAIT DISTURBANCE   . Edema   . URINARY INCONTINENCE   . TRANSIENT ISCHEMIC ATTACK, HX OF   . DIVERTICULITIS, HX OF   . HYPERGLYCEMIA   . Venous stasis dermatitis     R>L LE, chronic  . Type II or unspecified type diabetes mellitus with neurological manifestations, uncontrolled     Qualifier: Diagnosis of  By: Asa Lente MD, Jannifer Rodney Breast cancer [facility])    Past Surgical History  Procedure Laterality Date  . Breast surgery      05/31/04 2 lumps removed, s/p XRT and chemo  . Abdominal hysterectomy    . Breast biopsy  2005  . Urinary sling  2007  . Itp      1991 [facility] [place]   Family History  Problem Relation Age of Onset  . Breast cancer Mother   . Kidney disease Mother   . Stroke Mother   . Prostate cancer Father   . Heart disease Father    Social History  Substance Use Topics  . Smoking status: Never Smoker   . Smokeless tobacco: None     Comment: lives with spouse at [facility]  . Alcohol Use: No   OB History     No data available     Review of Systems  Unable to perform ROS: Patient unresponsive      Allergies  Codeine and Tuberculin tests  Home Medications   Prior to Admission medications   Medication Sig Start Date End Date Taking? Authorizing Provider  aspirin 81 MG tablet Take 1 tablet (81 mg total) by mouth daily. 08/05/12   Rowe Clack, MD  atorvastatin (LIPITOR) 10 MG tablet Take 1 tablet (10 mg total) by mouth daily. 04/25/15   Gerlene Fee, NP  beclomethasone (QVAR) 80 MCG/ACT inhaler Inhale 2 puffs into the lungs 2 (two) times daily.     Historical Provider, MD  Calcium Carbonate-Vitamin D (CALTRATE 600+D) 600-400 MG-UNIT per tablet Take 1 tablet by mouth daily. 07/20/12   Rowe Clack, MD  Cholecalciferol (VITAMIN D3) 1000 UNITS CAPS Take 2 capsules (2,000 Units total) by mouth daily. 08/24/12   Rowe Clack, MD  Cranberry 450 MG CAPS Take 450 mg by mouth daily. 08/16/15   Gerlene Fee, NP  donepezil (ARICEPT) 10 MG tablet Take 1 tablet (10 mg total) by mouth at bedtime. 07/02/12   Rowe Clack, MD  fluocinonide cream (LIDEX) 0.05 % Apply topically 2 (two) times daily as needed.    Historical Provider, MD  furosemide (LASIX) 20 MG tablet Take 20 mg by mouth daily.     Historical Provider, MD  gabapentin (NEURONTIN) 300 MG capsule Take 1 capsule (300 mg total) by mouth at bedtime. 09/30/12   Rowe Clack, MD  magnesium hydroxide (MILK OF MAGNESIA) 400 MG/5ML suspension Take 30 mLs by mouth daily as needed for mild constipation.    Historical Provider, MD  Memantine HCl ER (NAMENDA XR) 28 MG CP24 Take 28 mg by mouth daily.    Historical Provider, MD  PARoxetine (PAXIL) 20 MG tablet Take 1 tablet (20 mg total) by mouth every morning. 07/20/12   Rowe Clack, MD  travoprost, benzalkonium, (TRAVATAN) 0.004 % ophthalmic solution Place 1 drop into both eyes daily. For glaucoma    Historical Provider, MD  vitamin C (ASCORBIC ACID) 500 MG tablet Take 1  tablet (500 mg total) by mouth daily. 08/16/15   Gerlene Fee, NP   BP 102/63 mmHg  Pulse 133  Temp(Src) 103.3 F (39.6 C) (Rectal)  Resp 36  Wt 147 lb 11.3 oz (67 kg)  SpO2 94% Physical Exam  Nursing note and vitals reviewed.  82 year old female, resting comfortably and in no acute distress. Vital signs are significant for fever, tachypnea, tachycardia. Oxygen saturation is 94%, which is normal. Head is normocephalic and atraumatic. Pupils are 3 mm and minimally reactive. Oropharynx is clear. Neck is nontender and supple without adenopathy or JVD. Back is nontender and there is no CVA tenderness. Lungs are clear without rales, wheezes, or rhonchi. Chest is nontender. Status post left mastectomy. Heart is tachycardic without murmur. Abdomen is soft, flat, nontender without masses or hepatosplenomegaly and peristalsis is normoactive. Extremities have no cyanosis or edema, full range of motion is present. Skin is warm and dry without rash. Neurologic: She is minimally responsive to painful stimuli but without any purposeful movements. No focal findings.  ED Course  Procedures (including critical care time) Labs Review Results for orders placed or performed during the hospital encounter of 10/14/15  Comprehensive metabolic panel  Result Value Ref Range   Sodium 162 (HH) 135 - 145 mmol/L   Potassium 4.8 3.5 - 5.1 mmol/L   Chloride 119 (H) 101 - 111 mmol/L   CO2 24 22 - 32 mmol/L   Glucose, Bld 278 (H) 65 - 99 mg/dL   BUN 79 (H) 6 - 20 mg/dL   Creatinine, Ser 2.39 (H) 0.44 - 1.00 mg/dL   Calcium 10.3 8.9 - 10.3 mg/dL   Total Protein 8.7 (H) 6.5 - 8.1 g/dL   Albumin 3.0 (L) 3.5 - 5.0 g/dL   AST 103 (H) 15 - 41 U/L   ALT 53 14 - 54 U/L   Alkaline Phosphatase 74 38 - 126 U/L   Total Bilirubin 1.3 (H) 0.3 - 1.2 mg/dL   GFR calc non Af Amer 18 (L) >60 mL/min   GFR calc Af Amer 21 (L) >60 mL/min   Anion gap 19 (H) 5 - 15  CBC WITH DIFFERENTIAL  Result Value Ref Range   WBC  19.4 (H) 4.0 - 10.5 K/uL   RBC 6.05 (H) 3.87 - 5.11 MIL/uL   Hemoglobin 17.9 (H) 12.0 - 15.0 g/dL   HCT 56.8 (H) 36.0 - 46.0 %   MCV 93.9 78.0 - 100.0 fL   MCH 29.6 26.0 - 34.0 pg   MCHC 31.5 30.0 - 36.0 g/dL   RDW 15.5 11.5 - 15.5 %   Platelets 321 150 - 400 K/uL  Neutrophils Relative % 76 %   Lymphocytes Relative 14 %   Monocytes Relative 10 %   Eosinophils Relative 0 %   Basophils Relative 0 %   Neutro Abs 14.8 (H) 1.7 - 7.7 K/uL   Lymphs Abs 2.7 0.7 - 4.0 K/uL   Monocytes Absolute 1.9 (H) 0.1 - 1.0 K/uL   Eosinophils Absolute 0.0 0.0 - 0.7 K/uL   Basophils Absolute 0.0 0.0 - 0.1 K/uL   Smear Review MORPHOLOGY UNREMARKABLE   Urinalysis, Routine w reflex microscopic (not at [facility])  Result Value Ref Range   Color, Urine YELLOW (A) YELLOW   APPearance TURBID (A) CLEAR   Specific Gravity, Urine 1.016 1.005 - 1.030   pH 8.0 5.0 - 8.0   Glucose, UA NEGATIVE NEGATIVE mg/dL   Hgb urine dipstick LARGE (A) NEGATIVE   Bilirubin Urine NEGATIVE NEGATIVE   Ketones, ur 15 (A) NEGATIVE mg/dL   Protein, ur 100 (A) NEGATIVE mg/dL   Nitrite NEGATIVE NEGATIVE   Leukocytes, UA LARGE (A) NEGATIVE  Procalcitonin  Result Value Ref Range   Procalcitonin 0.22 ng/mL  Urine microscopic-add on  Result Value Ref Range   Squamous Epithelial / LPF 0-5 (A) NONE SEEN   WBC, UA TOO NUMEROUS TO COUNT 0 - 5 WBC/hpf   RBC / HPF 6-30 0 - 5 RBC/hpf   Bacteria, UA MANY (A) NONE SEEN   Urine-Other MUCOUS PRESENT   I-Stat CG4 Lactic Acid, ED  (not at  [facility])  Result Value Ref Range   Lactic Acid, Venous 3.09 (HH) 0.5 - 2.0 mmol/L   Comment NOTIFIED PHYSICIAN   CBG monitoring, ED  Result Value Ref Range   Glucose-Capillary 330 (H) 65 - 99 mg/dL   Imaging Review Dg Chest Port 1 View  10/14/2015  CLINICAL DATA:  Code sepsis. Acute onset of fever. Initial encounter. EXAM: PORTABLE CHEST 1 VIEW COMPARISON:  Chest radiograph performed 11/29/2011 FINDINGS: The lungs are well-aerated. Mild left midlung  atelectasis or scarring is noted. There is no evidence of pleural effusion or pneumothorax. The cardiomediastinal silhouette is borderline normal in size. No acute osseous abnormalities are seen. IMPRESSION: Mild left midlung atelectasis or scarring noted. Lungs otherwise clear. Electronically Signed   By: Garald Balding M.D.   On: 10/14/2015 06:16   I have personally reviewed and evaluated these images and lab results as part of my medical decision-making.   EKG Interpretation   Date/Time:  Saturday October 14 2015 06:32:18 EST Ventricular Rate:  121 PR Interval:  134 QRS Duration: 73 QT Interval:  326 QTC Calculation: 462 R Axis:   -91 Text Interpretation:  Sinus tachycardia Abnormal R-wave progression, late  transition Inferior infarct, old Nonspecific ST and T wave abnormality No  old tracing to compare Confirmed by [facility]  MD, Lendy Dittrich ([id number]) on 10/14/2015  6:37:15 AM      CRITICAL CARE Performed by: [id number] Total critical care time: 70 minutes Critical care time was exclusive of separately billable procedures and treating other patients. Critical care was necessary to treat or prevent imminent or life-threatening deterioration. Critical care was time spent personally by me on the following activities: development of treatment plan with patient and/or surrogate as well as nursing, discussions with consultants, evaluation of patient's response to treatment, examination of patient, obtaining history from patient or surrogate, ordering and performing treatments and interventions, ordering and review of laboratory studies, ordering and review of radiographic studies, pulse oximetry and re-evaluation of patient's condition.  MDM   Final diagnoses:  Urinary tract  infection without hematuria, site unspecified  Sepsis, due to unspecified organism ([place])  Acute kidney injury (nontraumatic) ([place])  Elevated lactic acid level  Hypernatremia  Elevated transaminase level    Patient with  fever, tachycardia, altered mental status. Code sepsis is activated. With source of infection unclear, she is started on vancomycin and Zosyn. Early goal-directed fluid therapy is initiated. Old records are reviewed and she has no relevant to her recent visits. All notes are nursing home visits from [facility].  Workup indicates urinary tract infection. She also has evidence of severe dehydration with severe hyponatremia and acute kidney injury. Heart rate has come down to 107 with fluids. Fluids will be changed to half-normal saline following the early goal-directed fluid bolus. Family is present and confirmed DO NOT RESUSCITATE status. They've been informed of the severity of her illness. Polycythemia is felt to be secondary to hemoconcentration. Mild elevation of transaminases and bilirubin are not felt to be clinically significant. Case is discussed with Ronnald Collum, mid-level provider working with Dr. Sheran Fava, who agrees to admit the patient to stepdown unit.  Delora Fuel, MD [id number] [phone]

## 2015-10-14 NOTE — Progress Notes (Signed)
ANTIBIOTIC CONSULT NOTE - INITIAL  Pharmacy Consult for Cefepime  Indication: Sepsis   Allergies  Allergen Reactions  . Codeine Other (See Comments)    Reaction unknown  . Tuberculin Tests     Patient Measurements: Weight: 147 lb 11.3 oz (67 kg)   Vital Signs: Temp: 98.8 F (37.1 C) (01/28 1000) Temp Source: Axillary (01/28 1000) BP: 101/60 mmHg (01/28 1000) Pulse Rate: 104 (01/28 1000) Intake/Output from previous day: 01/27 0701 - 01/28 0700 In: 50 [I.V.:50] Out: -  Intake/Output from this shift:    Labs:  Recent Labs  10/14/15 0557  WBC 19.4*  HGB 17.9*  PLT 321  CREATININE 2.39*   Estimated Creatinine Clearance: 15.6 mL/min (by C-G formula based on Cr of 2.39). No results for input(s): VANCOTROUGH, VANCOPEAK, VANCORANDOM, GENTTROUGH, GENTPEAK, GENTRANDOM, TOBRATROUGH, TOBRAPEAK, TOBRARND, AMIKACINPEAK, AMIKACINTROU, AMIKACIN in the last 72 hours.   Microbiology: No results found for this or any previous visit (from the past 720 hour(s)).  Medical History: Past Medical History  Diagnosis Date  . ADENOCARCINOMA, BREAST   . DYSLIPIDEMIA   . DEMENTIA   . DEPRESSION   . ASTHMA   . GAIT DISTURBANCE   . Edema   . URINARY INCONTINENCE   . TRANSIENT ISCHEMIC ATTACK, HX OF   . DIVERTICULITIS, HX OF   . HYPERGLYCEMIA   . Venous stasis dermatitis     R>L LE, chronic  . Type II or unspecified type diabetes mellitus with neurological manifestations, uncontrolled     Qualifier: Diagnosis of  By: Asa Lente MD, Jannifer Rodney Breast cancer ([place])     Medications:  Prescriptions prior to admission  Medication Sig Dispense Refill Last Dose  . aspirin 81 MG tablet Take 1 tablet (81 mg total) by mouth daily. 30 tablet 11 10/13/2015 at Unknown time  . atorvastatin (LIPITOR) 10 MG tablet Take 1 tablet (10 mg total) by mouth daily. 90 tablet 3 10/13/2015 at Unknown time  . beclomethasone (QVAR) 80 MCG/ACT inhaler Inhale 2 puffs into the lungs 2 (two) times daily.     10/13/2015 at Unknown time  . Calcium Carbonate-Vitamin D (CALTRATE 600+D) 600-400 MG-UNIT per tablet Take 1 tablet by mouth daily. 30 tablet 11 10/13/2015 at Unknown time  . Cholecalciferol (VITAMIN D3) 1000 UNITS CAPS Take 2 capsules (2,000 Units total) by mouth daily. 60 capsule 11 10/13/2015 at Unknown time  . Cranberry 450 MG CAPS Take 450 mg by mouth daily. 90 capsule prn 10/13/2015 at Unknown time  . donepezil (ARICEPT) 10 MG tablet Take 1 tablet (10 mg total) by mouth at bedtime. 30 tablet 2 10/13/2015 at Unknown time  . furosemide (LASIX) 20 MG tablet Take 20 mg by mouth daily.    10/13/2015 at Unknown time  . gabapentin (NEURONTIN) 300 MG capsule Take 1 capsule (300 mg total) by mouth at bedtime. 30 capsule 11 10/13/2015 at Unknown time  . Memantine HCl ER (NAMENDA XR) 28 MG CP24 Take 28 mg by mouth daily.   10/13/2015 at Unknown time  . PARoxetine (PAXIL) 20 MG tablet Take 1 tablet (20 mg total) by mouth every morning. 30 tablet 11 10/13/2015 at Unknown time  . Travoprost, BAK Free, (TRAVATAN) 0.004 % SOLN ophthalmic solution Place 1 drop into both eyes at bedtime.   10/13/2015 at Unknown time  . vitamin C (ASCORBIC ACID) 500 MG tablet Take 1 tablet (500 mg total) by mouth daily. 90 tablet prn 10/13/2015 at Unknown time   Assessment: 76 YOF with h/o alzheimer's dementia  brought from nursing home after found unresponsive at the facility. She has a history of UTI's in the past and was treated with oral antibiotics. Pharmacy consulted to start IV Cefepime for empiric coverage of Sepsis likely UTI. WBC is elevated at 19.4. LA 2.61, PCT 0.22. She is acute renal failure with a SCr elevated at 2.39. CrCl ~ 10-20 mL/min.   Of note, she received a dose of IV Zosyn at [facility] today.  1/28 BCx2>> 1/20 UCx>>   Goal of Therapy:  Resolution of infection   Plan:  -Start IV Cefepime 1 gm IV Q 24 hours -Monitor renal fx, cultures and clinical progress -De-escalate based on culture results  Albertina Parr, PharmD., BCPS Clinical Pharmacist Pager [phone]

## 2015-10-14 NOTE — Progress Notes (Signed)
Initial Nutrition Assessment  DOCUMENTATION CODES:   Not applicable  INTERVENTION:   -RD will follow for diet advancement and supplement diet as appropriate  -If pt unable to safely take PO's and pt/family want to pursue aggressive measures, recommend:  Initiate Jevity 1.2 @ 20 ml/hr via NGT and increase by 10 ml every 12 hours to goal rate of 55 ml/hr.   Tube feeding regimen provides 1584 kcal (100% of needs), 73 grams of protein, and 1065 ml of H2O.   NUTRITION DIAGNOSIS:   Inadequate oral intake related to inability to eat as evidenced by NPO status.  GOAL:   Patient will meet greater than or equal to 90% of their needs  MONITOR:   Diet advancement, Labs, Weight trends, Skin, I & O's  REASON FOR ASSESSMENT:   Consult Assessment of nutrition requirement/status  ASSESSMENT:   Tamara Perez is a 82 y.o. female with a history of Alzheimer's dementia, brought from her nursing home to the emergency department after found unresponsive at the facility. The history is provided by her family.  Pt admitted with sepsis.   Attempted to examine pt multiple times, however, pt was receiving nursing care at times of multiple visits. Spoke with nurse tech, who reports pt recently arrived to the unit from the ED.   Per chart review, pt has experienced poor po intake and progressive wt loss PTA. Reviewed nursing home records, which indicated no nutritional supplement orders. SLP evaluated; pt is currently too lethargic to participate in evaluation and po's. She required feeding assistance PTA.   Wt hx reveals UBW of around 166#. Noted a progressive 14# (8.7%) wt loss over the past 6 months.   Pt with multiple blankets over her and unable to complete nutrition-focused physical exam at this time. This RD suspects some degree of malnutrition, however, unable to confirm at this time.  Per MD notes, pt with guarded prognosis with potential for pt to not survive hospitalization;  recommending palliative care follow-up.   Labs reviewed.   Diet Order:  Diet NPO time specified Except for: Sips with Meds  Skin:  Reviewed, no issues  Last BM:  PTA  Height:   Ht Readings from Last 1 Encounters:  08/16/15 4\' 11"  (1.499 m)    Weight:   Wt Readings from Last 1 Encounters:  10/14/15 147 lb 11.3 oz (67 kg)    Ideal Body Weight:  44.5 kg  BMI:  Body mass index is 29.82 kg/(m^2).  Estimated Nutritional Needs:   Kcal:  1450-1650  Protein:  65-75 grams  Fluid:  1.4-1.6 L  EDUCATION NEEDS:   No education needs identified at this time  Corinthia Helmers A. Jimmye Norman, RD, LDN, CDE Pager: [phone] After hours Pager: [phone]

## 2015-10-14 NOTE — Progress Notes (Signed)
Assessment upon arrival of EMS, pt is unresponsive, tachy 130's-140's, tachypneic low 40's very shallow rapid respirations, pt has a temp of greater than 103. Breath sounds are clear with decrease aeration throughout. Pt is 94% on 4L O2. MD/RN at bedside.

## 2015-10-14 NOTE — ED Notes (Signed)
Dr. Roxanne Mins is aware of pt. Receiving her 500 cc bolus of NSS with an elevated Sodium.   Continue with the Sepsis Protocol and the will start new fluids upon admission .

## 2015-10-14 NOTE — Progress Notes (Signed)
MD made aware of burst of SVT with HR up to 160s. Patient now resting comfortably. Will continue to monitor.

## 2015-10-14 NOTE — Progress Notes (Signed)
Lab at bedside

## 2015-10-14 NOTE — Progress Notes (Signed)
SLP Cancellation Note  Patient Details Name: Tamara Perez MRN: [id number] DOB: 1933-07-20   Cancelled treatment:       Reason Eval/Treat Not Completed: Fatigue/lethargy limiting ability to participate; SLP to closely monitor for PO readiness   Arvil Chaco MA, [place] Pathologist    Levi Aland 10/14/2015, 12:46 PM

## 2015-10-14 NOTE — ED Notes (Signed)
Daughters are surrounding pt. s bed.  Pt. Is resting comfortablly.

## 2015-10-15 ENCOUNTER — Inpatient Hospital Stay (HOSPITAL_COMMUNITY): Payer: Medicare Other

## 2015-10-15 DIAGNOSIS — R740 Nonspecific elevation of levels of transaminase and lactic acid dehydrogenase [LDH]: Secondary | ICD-10-CM

## 2015-10-15 DIAGNOSIS — G309 Alzheimer's disease, unspecified: Secondary | ICD-10-CM

## 2015-10-15 DIAGNOSIS — A419 Sepsis, unspecified organism: Principal | ICD-10-CM

## 2015-10-15 DIAGNOSIS — F0280 Dementia in other diseases classified elsewhere without behavioral disturbance: Secondary | ICD-10-CM

## 2015-10-15 DIAGNOSIS — I10 Essential (primary) hypertension: Secondary | ICD-10-CM

## 2015-10-15 DIAGNOSIS — E119 Type 2 diabetes mellitus without complications: Secondary | ICD-10-CM

## 2015-10-15 DIAGNOSIS — R6520 Severe sepsis without septic shock: Secondary | ICD-10-CM

## 2015-10-15 DIAGNOSIS — R7989 Other specified abnormal findings of blood chemistry: Secondary | ICD-10-CM

## 2015-10-15 DIAGNOSIS — N179 Acute kidney failure, unspecified: Secondary | ICD-10-CM

## 2015-10-15 LAB — COMPREHENSIVE METABOLIC PANEL
ALK PHOS: 57 U/L (ref 38–126)
ALT: 34 U/L (ref 14–54)
ANION GAP: 11 (ref 5–15)
AST: 33 U/L (ref 15–41)
Albumin: 2.4 g/dL — ABNORMAL LOW (ref 3.5–5.0)
BILIRUBIN TOTAL: 0.5 mg/dL (ref 0.3–1.2)
BUN: 59 mg/dL — AB (ref 6–20)
CO2: 26 mmol/L (ref 22–32)
Calcium: 8.7 mg/dL — ABNORMAL LOW (ref 8.9–10.3)
Chloride: 129 mmol/L — ABNORMAL HIGH (ref 101–111)
Creatinine, Ser: 1.61 mg/dL — ABNORMAL HIGH (ref 0.44–1.00)
GFR, EST AFRICAN AMERICAN: 34 mL/min — AB (ref >60)
GFR, EST NON AFRICAN AMERICAN: 29 mL/min — AB (ref >60)
GLUCOSE: 166 mg/dL — AB (ref 65–99)
Potassium: 3.1 mmol/L — ABNORMAL LOW (ref 3.5–5.1)
SODIUM: 166 mmol/L — AB (ref 135–145)
Total Protein: 6.3 g/dL — ABNORMAL LOW (ref 6.5–8.1)

## 2015-10-15 LAB — BASIC METABOLIC PANEL
Anion gap: 12 (ref 5–15)
BUN: 48 mg/dL — AB (ref 6–20)
CHLORIDE: 124 mmol/L — AB (ref 101–111)
CO2: 24 mmol/L (ref 22–32)
CREATININE: 1.38 mg/dL — AB (ref 0.44–1.00)
Calcium: 8.6 mg/dL — ABNORMAL LOW (ref 8.9–10.3)
GFR calc Af Amer: 41 mL/min — ABNORMAL LOW (ref >60)
GFR calc non Af Amer: 35 mL/min — ABNORMAL LOW (ref >60)
GLUCOSE: 310 mg/dL — AB (ref 65–99)
POTASSIUM: 3.1 mmol/L — AB (ref 3.5–5.1)
SODIUM: 160 mmol/L — AB (ref 135–145)

## 2015-10-15 LAB — GLUCOSE, CAPILLARY
GLUCOSE-CAPILLARY: 221 mg/dL — AB (ref 65–99)
Glucose-Capillary: 142 mg/dL — ABNORMAL HIGH (ref 65–99)
Glucose-Capillary: 303 mg/dL — ABNORMAL HIGH (ref 65–99)
Glucose-Capillary: 305 mg/dL — ABNORMAL HIGH (ref 65–99)

## 2015-10-15 LAB — CBC
HEMATOCRIT: 44.4 % (ref 36.0–46.0)
Hemoglobin: 13.2 g/dL (ref 12.0–15.0)
MCH: 29.2 pg (ref 26.0–34.0)
MCHC: 29.7 g/dL — AB (ref 30.0–36.0)
MCV: 98.2 fL (ref 78.0–100.0)
Platelets: 211 10*3/uL (ref 150–400)
RBC: 4.52 MIL/uL (ref 3.87–5.11)
RDW: 15.6 % — AB (ref 11.5–15.5)
WBC: 14.4 10*3/uL — ABNORMAL HIGH (ref 4.0–10.5)

## 2015-10-15 MED ORDER — DEXTROSE 5 % IV SOLN
INTRAVENOUS | Status: DC
  Administered 2015-10-15: 08:00:00 via INTRAVENOUS
  Administered 2015-10-16: 1000 mL via INTRAVENOUS

## 2015-10-15 MED ORDER — POTASSIUM CHLORIDE 10 MEQ/100ML IV SOLN
10.0000 meq | INTRAVENOUS | Status: AC
  Administered 2015-10-15 (×2): 10 meq via INTRAVENOUS
  Filled 2015-10-15 (×2): qty 100

## 2015-10-15 MED ORDER — SODIUM CHLORIDE 0.45 % IV SOLN
INTRAVENOUS | Status: DC
  Administered 2015-10-15: 06:00:00 via INTRAVENOUS

## 2015-10-15 MED ORDER — INSULIN ASPART 100 UNIT/ML ~~LOC~~ SOLN
0.0000 [IU] | SUBCUTANEOUS | Status: DC
  Administered 2015-10-15: 7 [IU] via SUBCUTANEOUS
  Administered 2015-10-15: 3 [IU] via SUBCUTANEOUS
  Administered 2015-10-15: 7 [IU] via SUBCUTANEOUS
  Administered 2015-10-16: 2 [IU] via SUBCUTANEOUS
  Administered 2015-10-16: 3 [IU] via SUBCUTANEOUS
  Administered 2015-10-16: 1 [IU] via SUBCUTANEOUS

## 2015-10-15 NOTE — Progress Notes (Signed)
[facility]                                                                              Patient Demographics  Tamara Perez, is a 82 y.o. female, DOB - 1933-03-08, [id number]  Admit date - 10/14/2015   Admitting Physician Janece Canterbury, MD  Outpatient Primary MD for the patient is Hennie Duos, MD  LOS - 1   Chief Complaint  Patient presents with  . Altered Mental Status      HPI on 10/14/2015 by Ms. Sharene Butters, PA Tamara Perez is a 82 y.o. female with a history of Alzheimer's dementia, brought from her nursing home to the emergency department after found unresponsive at the facility. The history is provided by her family. She was brought early this morning with fevers initially at the range of 100.3, Tachycardia, hypoxia requiring up to 4 L of oxygen on arrival. Family states she had several UTIs in the past, the last one about 8 or 9 months ago, treated with oral antibiotics.She had a urinary sling placed in 2007 for incontinence. The patient is incontinent of stools and urine, requiring diapers. Family did notice that her oral intake and urinary output was decreased over the last 3-4 days prior to admission, she required only one diaper change. Family reports patient feeling weaker over the last few days, but without shortness of breath or wheezing. No cough or sputum production. No hemoptysis. No apparent nausea, vomiting, or diarrhea. No sick contacts.  At the ED UA was positive for TNC WBC, turbid urine. CXR was negative. Max temp was 103 F, lactic acid was at 2.61, heart rate up to 130, and blood pressure borderline in the low 100s, lactate 3.09. She received received 2 L of fluid bolus in the emergency department and antibiotics initiated with some improvement of her BP and Temp and O2 sats. She was found to have AKI as well, with Sodium 162, creatinine up to 2.39 and be you when of 81. She has mild transamminitis, with AST of 103 and AST of 53, bilirubin  1.3 . CBC was remarkable for leukocytosis, with a white count of 19 and hemoglobin of 17.9 in the setting of dehydration.  Assessment & Plan   Severe Sepsis secondary to urinary tract infection -Upon admission was febrile with tachycardia, tachypneic, leukocytosis -Lactic acid was 3.09, has improved to 1.9 -Continue IV fluid -UA positive for urinary tract infection, pending urine culture -Pending blood cultures -CT scan of the head: No acute intracranial abnormality -Chest x-ray: Mild left midlung atelectasis or scarring, otherwise clear -Continue cefepime  Acute kidney injury -Baseline creatinine appears 0.7, was 2.39 upon admission -Continue IV fluids -Currently 1.61, continue to monitor BMP  Diabetes mellitus, type II, diet-controlled -Hemoglobin A1c in 2015 was 7 -Was not on home medications -Continue insulin sliding scale CBG monitoring  Chronic diastolic heart failure, presumed -No echocardiogram in the system -Was on home lasix, held secondary to sepsis and AKI  Elevated troponin -Likely secondary to sepsis and demand ischemia -Troponin peak 0.13, currently trending downward  Elevated transaminases -Resolved, Likely secondary to sepsis -Continue to monitor CMP  Hypernatremia -Sodium currently 166, will switch to D5 water  and continue to monitor BMP closely  COPD/asthma -Stable, continue home medications: Pulmicort  Dementia -Aricept and Namenda held  Depression -Paxil held  Dyslipidemia -Statin held  Code Status: DNR  Family Communication: Daughter at bedside  Disposition Plan: Admitted, continue to monitor in stepdown  Time Spent in minutes   30 minutes  Procedures  None  Consults   None  DVT Prophylaxis  Lovenox  Lab Results  Component Value Date   PLT 211 10/15/2015    Medications  Scheduled Meds: . aspirin  300 mg Rectal Daily  . budesonide (PULMICORT) nebulizer solution  0.25 mg Nebulization BID  . ceFEPime (MAXIPIME) IV  1 g  Intravenous Q24H  . enoxaparin (LOVENOX) injection  30 mg Subcutaneous Q24H  . insulin aspart  0-9 Units Subcutaneous 6 times per day  . latanoprost  1 drop Both Eyes QHS  . levETIRAcetam  500 mg Intravenous Q12H  . sodium chloride flush  3 mL Intravenous Q12H   Continuous Infusions: . dextrose 100 mL/hr at 10/15/15 0800   PRN Meds:.acetaminophen **OR** acetaminophen, metoprolol, ondansetron **OR** ondansetron (ZOFRAN) IV  Antibiotics    Anti-infectives    Start     Dose/Rate Route Frequency Ordered Stop   10/14/15 1200  ceFEPIme (MAXIPIME) 1 g in dextrose 5 % 50 mL IVPB     1 g 100 mL/hr over 30 Minutes Intravenous Every 24 hours 10/14/15 1044     10/14/15 0545  piperacillin-tazobactam (ZOSYN) IVPB 3.375 g     3.375 g 100 mL/hr over 30 Minutes Intravenous  Once 10/14/15 0544 10/14/15 0652   10/14/15 0545  vancomycin (VANCOCIN) IVPB 1000 mg/200 mL premix     1,000 mg 200 mL/hr over 60 Minutes Intravenous  Once 10/14/15 0544 10/14/15 [id number]      Subjective:   Tamara Perez seen and examined today.  Patient currently very somnolent however responsive to some questions. Patient does know her daughter who is at bedside. Although cannot follow commands or answer any questions appropriately.  Objective:   Filed Vitals:   10/15/15 0300 10/15/15 0417 10/15/15 0743 10/15/15 0800  BP: 93/66 104/50  110/58  Pulse: 97 87  89  Temp:  99.3 F (37.4 C)  100.3 F (37.9 C)  TempSrc:  Oral  Axillary  Resp: 24 29  14   Weight:      SpO2: 99% 99% 97% 99%    Wt Readings from Last 3 Encounters:  10/14/15 67 kg (147 lb 11.3 oz)  08/16/15 67.586 kg (149 lb)  05/25/15 70.308 kg (155 lb)     Intake/Output Summary (Last 24 hours) at 10/15/15 1147 Last data filed at 10/14/15 1900  Gross per 24 hour  Intake    950 ml  Output      0 ml  Net    950 ml    Exam  General: Well developed, thin, cachetic female, NAD  HEENT: NCAT, mucous membranes mildly dry.   Cardiovascular: S1 S2  auscultated, Regular rate and rhythm.  Respiratory: Clear to auscultation bilaterally   Abdomen: Soft, nontender, nondistended, + bowel sounds  Extremities: warm dry without cyanosis clubbing or edema  Neuro: AAOx1 (self and daughter). Cannot answer many questions appropriately.  Skin: Without rashes exudates or nodules  Data Review   Micro Results Recent Results (from the past 240 hour(s))  MRSA PCR Screening     Status: None   Collection Time: 10/14/15 11:58 AM  Result Value Ref Range Status   MRSA by PCR NEGATIVE NEGATIVE Final  Comment:        The GeneXpert MRSA Assay (FDA approved for NASAL specimens only), is one component of a comprehensive MRSA colonization surveillance program. It is not intended to diagnose MRSA infection nor to guide or monitor treatment for MRSA infections.     Radiology Reports Ct Head Wo Contrast  10/15/2015  CLINICAL DATA:  Progressive dementia presenting with sepsis secondary to urinary tract infection. Seizures. EXAM: CT HEAD WITHOUT CONTRAST TECHNIQUE: Contiguous axial images were obtained from the base of the skull through the vertex without intravenous contrast. COMPARISON:  MRI brain 02/20/2009 FINDINGS: Diffuse cerebral atrophy. Mild ventricular dilatation consistent with central atrophy. Low-attenuation changes in the deep white matter consistent with small vessel ischemia. No mass effect or midline shift. No abnormal extra-axial fluid collections. Gray-white matter junctions are distinct. Basal cisterns are not effaced. No evidence of acute intracranial hemorrhage. No depressed skull fractures. Visualized paranasal sinuses and mastoid air cells are not opacified. Vascular calcifications. IMPRESSION: No acute intracranial abnormalities. Chronic atrophy and small vessel ischemic changes. Electronically Signed   By: Lucienne Capers M.D.   On: 10/15/2015 03:38   Dg Chest Port 1 View  10/14/2015  CLINICAL DATA:  Code sepsis. Acute onset of  fever. Initial encounter. EXAM: PORTABLE CHEST 1 VIEW COMPARISON:  Chest radiograph performed 11/29/2011 FINDINGS: The lungs are well-aerated. Mild left midlung atelectasis or scarring is noted. There is no evidence of pleural effusion or pneumothorax. The cardiomediastinal silhouette is borderline normal in size. No acute osseous abnormalities are seen. IMPRESSION: Mild left midlung atelectasis or scarring noted. Lungs otherwise clear. Electronically Signed   By: Garald Balding M.D.   On: 10/14/2015 06:16    CBC  Recent Labs Lab 10/14/15 0557 10/15/15 0407  WBC 19.4* 14.4*  HGB 17.9* 13.2  HCT 56.8* 44.4  PLT 321 211  MCV 93.9 98.2  MCH 29.6 29.2  MCHC 31.5 29.7*  RDW 15.5 15.6*  LYMPHSABS 2.7  --   MONOABS 1.9*  --   EOSABS 0.0  --   BASOSABS 0.0  --     Chemistries   Recent Labs Lab 10/14/15 0557 10/14/15 1430 10/15/15 0407  NA 162*  --  166*  K 4.8  --  3.1*  CL 119*  --  129*  CO2 24  --  26  GLUCOSE 278*  --  166*  BUN 79*  --  59*  CREATININE 2.39*  --  1.61*  CALCIUM 10.3  --  8.7*  MG  --  2.0  --   AST 103*  --  33  ALT 53  --  34  ALKPHOS 74  --  57  BILITOT 1.3*  --  0.5   ------------------------------------------------------------------------------------------------------------------ estimated creatinine clearance is 23.2 mL/min (by C-G formula based on Cr of 1.61). ------------------------------------------------------------------------------------------------------------------ No results for input(s): HGBA1C in the last 72 hours. ------------------------------------------------------------------------------------------------------------------ No results for input(s): CHOL, HDL, LDLCALC, TRIG, CHOLHDL, LDLDIRECT in the last 72 hours. ------------------------------------------------------------------------------------------------------------------ No results for input(s): TSH, T4TOTAL, T3FREE, THYROIDAB in the last 72 hours.  Invalid input(s):  FREET3 ------------------------------------------------------------------------------------------------------------------ No results for input(s): VITAMINB12, FOLATE, FERRITIN, TIBC, IRON, RETICCTPCT in the last 72 hours.  Coagulation profile  Recent Labs Lab 10/14/15 1111  INR 1.56*    No results for input(s): DDIMER in the last 72 hours.  Cardiac Enzymes  Recent Labs Lab 10/14/15 1111 10/14/15 1430 10/14/15 2155  TROPONINI 0.13* 0.11* 0.10*   ------------------------------------------------------------------------------------------------------------------ Invalid input(s): POCBNP    Laloni Rowton D.O. on 10/15/2015 at 11:47  AM  Between 7am to 7pm - Pager - [phone]  After 7pm go to [email or web address] - password [email or web address]  And look for the night coverage person covering for me after hours  [facility] Office  [phone]

## 2015-10-15 NOTE — Evaluation (Signed)
Clinical/Bedside Swallow Evaluation Patient Details  Name: Tamara Perez MRN: [id number] Date of Birth: [birth year]  Today's Date: 10/15/2015 Time: SLP Start Time (ACUTE ONLY): [id number] SLP Stop Time (ACUTE ONLY): 0909 SLP Time Calculation (min) (ACUTE ONLY): 18 min  Past Medical History:  Past Medical History  Diagnosis Date  . ADENOCARCINOMA, BREAST   . DYSLIPIDEMIA   . DEMENTIA   . DEPRESSION   . ASTHMA   . GAIT DISTURBANCE   . Edema   . URINARY INCONTINENCE   . TRANSIENT ISCHEMIC ATTACK, HX OF   . DIVERTICULITIS, HX OF   . HYPERGLYCEMIA   . Venous stasis dermatitis     R>L LE, chronic  . Type II or unspecified type diabetes mellitus with neurological manifestations, uncontrolled     Qualifier: Diagnosis of  By: Asa Lente MD, Jannifer Rodney Breast cancer [facility])    Past Surgical History:  Past Surgical History  Procedure Laterality Date  . Breast surgery      05/31/04 2 lumps removed, s/p XRT and chemo  . Abdominal hysterectomy    . Breast biopsy  2005  . Urinary sling  2007  . Itp      1991 [facility] [place]   HPI:  82 year old female with history of advanced Alzheimer's dementia, bedridden for the last year who requires assistance with meals, recurrent UTI who presented from SNF with decreased urine, PO intake, low-grade fevers and progressive lethargy. CXR without signs of acute infection. PTA, daughters report that pt was on a chopped diet and thin liquids.   Assessment / Plan / Recommendation Clinical Impression  Pt had good oral acceptance and mastication of ice chips, but only an intermittent pharyngeal swallow trigger despite Max verbal/tactile cues from SLP. She was verbally perseverative, and did not stop talking to initiate a swallow. Presentation of thin liquids via straw required increased labial seal for intake, which stopped perseverative utterances long enough to perform functional appearing swallows, even with multiple large,  consecutive boluses. Automaticity of swallow was improved with boluses of puree as well. Recommend initiation of Dys 1 diet and thin liquids when pt is fully alert. Will continue to follow for tolerance and possible advancement of solids.    Aspiration Risk  Mild aspiration risk;Moderate aspiration risk    Diet Recommendation Dysphagia 1 (Puree);Thin liquid   Liquid Administration via: Straw Medication Administration: Crushed with puree Supervision: Staff to assist with self feeding;Full supervision/cueing for compensatory strategies Compensations: Minimize environmental distractions;Slow rate;Small sips/bites Postural Changes: Seated upright at 90 degrees    Other  Recommendations Oral Care Recommendations: Oral care BID   Follow up Recommendations  Skilled Nursing facility    Frequency and Duration min 2x/week  2 weeks       Prognosis Prognosis for Safe Diet Advancement: Good Barriers to Reach Goals: Cognitive deficits      Swallow Study   General HPI: 82 year old female with history of advanced Alzheimer's dementia, bedridden for the last year who requires assistance with meals, recurrent UTI who presented from SNF with decreased urine, PO intake, low-grade fevers and progressive lethargy. CXR without signs of acute infection. PTA, daughters report that pt was on a chopped diet and thin liquids. Type of Study: Bedside Swallow Evaluation Previous Swallow Assessment: none in chart Diet Prior to this Study: NPO Temperature Spikes Noted: Yes (100.4) Respiratory Status: Nasal cannula History of Recent Intubation: No Behavior/Cognition: Alert;Cooperative;Requires cueing Oral Cavity Assessment: Within Functional Limits Oral Care Completed by SLP: No Self-Feeding Abilities:  Total assist Patient Positioning: Upright in bed Baseline Vocal Quality: Normal Volitional Swallow: Unable to elicit    Oral/Motor/Sensory Function Overall Oral Motor/Sensory Function:  (does not follow  commands well but appears WFL)   Ice Chips Ice chips: Impaired Presentation: Spoon Pharyngeal Phase Impairments: Suspected delayed Swallow;Other (comments) (inconsistent swallow trigger)   Thin Liquid Thin Liquid: Within functional limits Presentation: Straw    Nectar Thick Nectar Thick Liquid: Not tested   Honey Thick Honey Thick Liquid: Not tested   Puree Puree: Within functional limits Presentation: Spoon   Solid   GO   Solid: Not tested       Germain Osgood, M.A. CCC-SLP [phone]  Germain Osgood 10/15/2015,9:19 AM

## 2015-10-15 NOTE — Consult Note (Signed)
WOC wound consult note Reason for Consult: pressure injury Wound type: MASD (moisture associated skin damage) related to urinary incontinence  Patient with advanced dementia, incontinent of B/B Pressure Ulcer POA:No Wound bed: some blanchable redness noted bilateral upper buttock and some skin pigmentation changes, however I do not believe at this time these are pressure, appears to be reddened uniformly over the buttocks and with urinary incontinence more likely related to this Drainage (amount, consistency, odor) none Periwound: intact Dressing procedure/placement/frequency: Barrier cream to affected areas at least BID and after each episode of incontinence.  NO dry flow pads to be used under or between patient's legs.  Use dermatherapy only.   Discussed POC with patient and bedside nurse.  Re consult if needed, will not follow at this time. Thanks  Sabrin Dunlevy [place], [place] [phone])

## 2015-10-16 ENCOUNTER — Ambulatory Visit (HOSPITAL_COMMUNITY): Payer: Medicare Other

## 2015-10-16 DIAGNOSIS — E872 Acidosis: Secondary | ICD-10-CM

## 2015-10-16 DIAGNOSIS — N179 Acute kidney failure, unspecified: Secondary | ICD-10-CM | POA: Insufficient documentation

## 2015-10-16 DIAGNOSIS — Z515 Encounter for palliative care: Secondary | ICD-10-CM

## 2015-10-16 DIAGNOSIS — E870 Hyperosmolality and hypernatremia: Secondary | ICD-10-CM | POA: Insufficient documentation

## 2015-10-16 DIAGNOSIS — R6520 Severe sepsis without septic shock: Secondary | ICD-10-CM

## 2015-10-16 DIAGNOSIS — A419 Sepsis, unspecified organism: Secondary | ICD-10-CM | POA: Insufficient documentation

## 2015-10-16 DIAGNOSIS — R7401 Elevation of levels of liver transaminase levels: Secondary | ICD-10-CM | POA: Insufficient documentation

## 2015-10-16 DIAGNOSIS — R7989 Other specified abnormal findings of blood chemistry: Secondary | ICD-10-CM | POA: Insufficient documentation

## 2015-10-16 DIAGNOSIS — N390 Urinary tract infection, site not specified: Secondary | ICD-10-CM | POA: Insufficient documentation

## 2015-10-16 DIAGNOSIS — R740 Nonspecific elevation of levels of transaminase and lactic acid dehydrogenase [LDH]: Secondary | ICD-10-CM

## 2015-10-16 LAB — CBC
HEMATOCRIT: 39.7 % (ref 36.0–46.0)
HEMOGLOBIN: 12.5 g/dL (ref 12.0–15.0)
MCH: 29.4 pg (ref 26.0–34.0)
MCHC: 31.5 g/dL (ref 30.0–36.0)
MCV: 93.4 fL (ref 78.0–100.0)
Platelets: 151 10*3/uL (ref 150–400)
RBC: 4.25 MIL/uL (ref 3.87–5.11)
RDW: 15.3 % (ref 11.5–15.5)
WBC: 16.5 10*3/uL — AB (ref 4.0–10.5)

## 2015-10-16 LAB — GLUCOSE, CAPILLARY
GLUCOSE-CAPILLARY: 117 mg/dL — AB (ref 65–99)
GLUCOSE-CAPILLARY: 232 mg/dL — AB (ref 65–99)
Glucose-Capillary: 181 mg/dL — ABNORMAL HIGH (ref 65–99)
Glucose-Capillary: 159 mg/dL — ABNORMAL HIGH (ref 65–99)

## 2015-10-16 LAB — COMPREHENSIVE METABOLIC PANEL
ALK PHOS: 49 U/L (ref 38–126)
ALT: 26 U/L (ref 14–54)
AST: 43 U/L — AB (ref 15–41)
Albumin: 2 g/dL — ABNORMAL LOW (ref 3.5–5.0)
Anion gap: 13 (ref 5–15)
BILIRUBIN TOTAL: 2.3 mg/dL — AB (ref 0.3–1.2)
BUN: 32 mg/dL — AB (ref 6–20)
CO2: 19 mmol/L — ABNORMAL LOW (ref 22–32)
CREATININE: 1.11 mg/dL — AB (ref 0.44–1.00)
Calcium: 8.3 mg/dL — ABNORMAL LOW (ref 8.9–10.3)
Chloride: 121 mmol/L — ABNORMAL HIGH (ref 101–111)
GFR calc Af Amer: 53 mL/min — ABNORMAL LOW (ref >60)
GFR, EST NON AFRICAN AMERICAN: 46 mL/min — AB (ref >60)
GLUCOSE: 204 mg/dL — AB (ref 65–99)
Potassium: 4.9 mmol/L (ref 3.5–5.1)
Sodium: 153 mmol/L — ABNORMAL HIGH (ref 135–145)
TOTAL PROTEIN: 5.2 g/dL — AB (ref 6.5–8.1)

## 2015-10-16 LAB — URINE CULTURE

## 2015-10-16 MED ORDER — GLYCOPYRROLATE 1 MG PO TABS
1.0000 mg | ORAL_TABLET | ORAL | Status: DC | PRN
  Filled 2015-10-16: qty 1

## 2015-10-16 MED ORDER — LORAZEPAM 0.5 MG PO TABS
0.5000 mg | ORAL_TABLET | Freq: Four times a day (QID) | ORAL | Status: DC | PRN
  Administered 2015-10-16 – 2015-10-17 (×2): 0.5 mg via SUBLINGUAL
  Filled 2015-10-16 (×2): qty 1

## 2015-10-16 MED ORDER — POLYVINYL ALCOHOL 1.4 % OP SOLN
1.0000 [drp] | Freq: Four times a day (QID) | OPHTHALMIC | Status: DC | PRN
  Filled 2015-10-16: qty 15

## 2015-10-16 MED ORDER — PAROXETINE HCL 20 MG PO TABS
20.0000 mg | ORAL_TABLET | Freq: Every day | ORAL | Status: DC
  Administered 2015-10-16 – 2015-10-18 (×3): 20 mg via ORAL
  Filled 2015-10-16 (×3): qty 1

## 2015-10-16 MED ORDER — MORPHINE SULFATE (CONCENTRATE) 10 MG/0.5ML PO SOLN
5.0000 mg | ORAL | Status: DC | PRN
  Administered 2015-10-17 (×2): 5 mg via ORAL
  Filled 2015-10-16 (×2): qty 0.5

## 2015-10-16 MED ORDER — GLYCOPYRROLATE 0.2 MG/ML IJ SOLN
0.2000 mg | INTRAMUSCULAR | Status: DC | PRN
  Filled 2015-10-16: qty 1

## 2015-10-16 MED ORDER — BIOTENE DRY MOUTH MT LIQD: 15.0000 mL | OROMUCOSAL | Status: DC | PRN

## 2015-10-16 MED ORDER — HALOPERIDOL LACTATE 2 MG/ML PO CONC
1.0000 mg | Freq: Four times a day (QID) | ORAL | Status: DC | PRN
  Administered 2015-10-17 (×2): 2 mg via ORAL
  Filled 2015-10-16 (×4): qty 1

## 2015-10-16 NOTE — Consult Note (Signed)
Consultation Note Date: 10/16/2015   Patient Name: Tamara Perez  DOB: 1932-12-03  MRN: [id number]  Age / Sex: 82 y.o., female  PCP: Hennie Duos, MD Referring Physician: Cristal Ford, DO  Reason for Consultation: Establishing goals of care    Clinical Assessment/Narrative: I met today with Ms. Kroboth who appears content and pleasant but fidgeting with her blankets and cardiac leads and repeating phrases. She does not overtly exhibit any signs of pain or discomfort but agitation which is mostly her baseline according to family. I met daughters, [name] and Langley Gauss, her HCPOAs. They tearfully tell me that they had extensive discussions with their parents (father has already passed) and [name] says that she knows with her mothers dementia that this will become a cycle for her - she already requires diapers and total care. They say that Ms. Sytsma would not want to live like this and they want to respect her wishes and focus on comfort care for her and "allow a natural course." They do not want to prolong her suffering but do not want to hasten her death is what they explained. They are still hopeful for some good time to spend with their mother.   We discussed plan on managing symptoms with sublingual medication that she doesn't even have to swallow to be effective. [name] concerned about her not receiving her Paxil she has been taking for years so we will start this back but minimize medications otherwise - no need to medicate for cholesterol or continue Namenda/Aricept - family agrees. Plan to discontinue tele and move to regular floor with better environment for her agitation while we evaluate for hospice facility.   Contacts/Participants in Discussion: Primary Decision Maker: [name] and Denies   Relationship to Patient daughters HCPOA: yes   SUMMARY OF RECOMMENDATIONS - Full comfort care with advanced dementia  (dtrs say their mother was clear in the past she would not want to live this way) - NOT happy with current SNF and this is not an option for her to return even with hospice - Hospice facility   Code Status/Advance Care Planning: DNR    Code Status Orders        Start     Ordered   10/16/15 1223  Do not attempt resuscitation (DNR)   Continuous    Question Answer Comment  In the event of cardiac or respiratory ARREST Do not call a "code blue"   In the event of cardiac or respiratory ARREST Do not perform Intubation, CPR, defibrillation or ACLS   In the event of cardiac or respiratory ARREST Use medication by any route, position, wound care, and other measures to relive pain and suffering. May use oxygen, suction and manual treatment of airway obstruction as needed for comfort.      10/16/15 1223    Code Status History    Date Active Date Inactive Code Status Order ID Comments User Context   10/14/2015 10:38 AM 10/16/2015 12:23 PM DNR [id number]  Rondel Jumbo, PA-C Inpatient    Advance Directive Documentation        Most Recent Value   Type of Advance Directive  Out of facility DNR (pink MOST or yellow form)   Pre-existing out of facility DNR order (yellow form or pink MOST form)  Yellow form placed in chart (order not valid for inpatient use)   "MOST" Form in Place?         Symptom Management:   Pain/dyspnea: Morphine roxanol 5 mg every hour prn.  Agitation/anxiety: Ativan 0.5 mg every 6 hours prn.   Delirium: Haldol 1-2 mg every 6 hours prn.   Secretions: Robinul 0.2 mg every 4 hours prn.   Palliative Prophylaxis:   Aspiration, Bowel Regimen, Delirium Protocol, Frequent Pain Assessment, Oral Care and Turn Reposition  Additional Recommendations (Limitations, Scope, Preferences):  Full Comfort Care  Psycho-social/Spiritual:  Support System: Strong Desire for further Chaplaincy support:yes Additional Recommendations: Caregiving  Support/Resources, Education on  Hospice and Grief/Bereavement Support  Prognosis: < 2-3 weeks  Discharge Planning: Hospice facility   Chief Complaint/ Primary Diagnoses: Present on Admission:  . Sepsis ([place]) . Alzheimer's disease . Essential hypertension, benign  I have reviewed the medical record, interviewed the patient and family, and examined the patient. The following aspects are pertinent.  Past Medical History  Diagnosis Date  . ADENOCARCINOMA, BREAST   . DYSLIPIDEMIA   . DEMENTIA   . DEPRESSION   . ASTHMA   . GAIT DISTURBANCE   . Edema   . URINARY INCONTINENCE   . TRANSIENT ISCHEMIC ATTACK, HX OF   . DIVERTICULITIS, HX OF   . HYPERGLYCEMIA   . Venous stasis dermatitis     R>L LE, chronic  . Type II or unspecified type diabetes mellitus with neurological manifestations, uncontrolled     Qualifier: Diagnosis of  By: Asa Lente MD, Jannifer Rodney Breast cancer [facility])    Social History   Social History  . Marital Status: Married    Spouse Name: N/A  . Number of Children: N/A  . Years of Education: N/A   Social History Main Topics  . Smoking status: Never Smoker   . Smokeless tobacco: None     Comment: lives [facility]  . Alcohol Use: No  . Drug Use: No  . Sexual Activity: Not Asked   Other Topics Concern  . None   Social History Narrative   Family History  Problem Relation Age of Onset  . Breast cancer Mother   . Kidney disease Mother   . Stroke Mother   . Prostate cancer Father   . Heart disease Father    Scheduled Meds: . budesonide (PULMICORT) nebulizer solution  0.25 mg Nebulization BID  . latanoprost  1 drop Both Eyes QHS  . PARoxetine  20 mg Oral Daily  . sodium chloride flush  3 mL Intravenous Q12H   Continuous Infusions:  PRN Meds:.acetaminophen **OR** acetaminophen, antiseptic oral rinse, glycopyrrolate **OR** glycopyrrolate **OR** glycopyrrolate, haloperidol, LORazepam, morphine CONCENTRATE, ondansetron **OR** ondansetron (ZOFRAN) IV,  polyvinyl alcohol Medications Prior to Admission:  Prior to Admission medications   Medication Sig Start Date End Date Taking? Authorizing Provider  aspirin 81 MG tablet Take 1 tablet (81 mg total) by mouth daily. 08/05/12  Yes Rowe Clack, MD  atorvastatin (LIPITOR) 10 MG tablet Take 1 tablet (10 mg total) by mouth daily. 04/25/15  Yes Gerlene Fee, NP  beclomethasone (QVAR) 80 MCG/ACT inhaler Inhale 2 puffs into the lungs 2 (two) times daily.    Yes Historical Provider, MD  Calcium Carbonate-Vitamin D (CALTRATE 600+D) 600-400 MG-UNIT per tablet Take 1 tablet by mouth daily. 07/20/12  Yes Rowe Clack, MD  Cholecalciferol (VITAMIN D3) 1000 UNITS CAPS Take 2 capsules (2,000 Units total) by mouth daily. 08/24/12  Yes Rowe Clack, MD  Cranberry 450 MG CAPS Take 450 mg by mouth daily. 08/16/15  Yes Gerlene Fee, NP  donepezil (ARICEPT) 10 MG tablet Take 1 tablet (10 mg total) by mouth at  bedtime. 07/02/12  Yes Rowe Clack, MD  furosemide (LASIX) 20 MG tablet Take 20 mg by mouth daily.    Yes Historical Provider, MD  gabapentin (NEURONTIN) 300 MG capsule Take 1 capsule (300 mg total) by mouth at bedtime. 09/30/12  Yes Rowe Clack, MD  Memantine HCl ER (NAMENDA XR) 28 MG CP24 Take 28 mg by mouth daily.   Yes Historical Provider, MD  PARoxetine (PAXIL) 20 MG tablet Take 1 tablet (20 mg total) by mouth every morning. 07/20/12  Yes Rowe Clack, MD  Travoprost, BAK Free, (TRAVATAN) 0.004 % SOLN ophthalmic solution Place 1 drop into both eyes at bedtime.   Yes Historical Provider, MD  vitamin C (ASCORBIC ACID) 500 MG tablet Take 1 tablet (500 mg total) by mouth daily. 08/16/15  Yes Gerlene Fee, NP   Allergies  Allergen Reactions  . Codeine Other (See Comments)    Reaction unknown  . Tuberculin Tests     Review of Systems  Unable to perform ROS   Physical Exam  Constitutional: She appears well-developed.  HENT:  Head: Normocephalic and atraumatic.    Cardiovascular: Tachycardia present.   Respiratory: Effort normal. No accessory muscle usage. No tachypnea. No respiratory distress.  GI: Soft. Normal appearance.  Neurological: She is alert. She is disoriented.    Vital Signs: BP 89/69 mmHg  Pulse 56  Temp(Src) 97.1 F (36.2 C) (Axillary)  Resp 17  Wt 67 kg (147 lb 11.3 oz)  SpO2 95%  SpO2: SpO2: 95 % O2 Device:SpO2: 95 % O2 Flow Rate: .O2 Flow Rate (L/min): 2 L/min  IO: Intake/output summary:  Intake/Output Summary (Last 24 hours) at 10/16/15 1248 Last data filed at 10/16/15 0515  Gross per 24 hour  Intake   2588 ml  Output      0 ml  Net   2588 ml    LBM: Last BM Date: 10/15/15 Baseline Weight: Weight: 67 kg (147 lb 11.3 oz) Most recent weight: Weight: 67 kg (147 lb 11.3 oz)      Palliative Assessment/Data:  Flowsheet Rows        Most Recent Value   Intake Tab    Referral Department  Hospitalist   Unit at Time of Referral  Intermediate Care Unit   Palliative Care Primary Diagnosis  Sepsis/Infectious Disease   Date Notified  10/15/15   Palliative Care Type  New Palliative care   Reason for referral  Clarify Goals of Care, End of Life Care Assistance   Date of Admission  10/14/15   # of days IP prior to Palliative referral  1   Clinical Assessment    Psychosocial & Spiritual Assessment    Palliative Care Outcomes       Additional Data Reviewed:  CBC:    Component Value Date/Time   WBC 16.5* 10/16/2015 0456   WBC 9.4 04/07/2014   WBC 7.5 01/31/2011 1409   HGB 12.5 10/16/2015 0456   HGB 13.7 01/31/2011 1409   HCT 39.7 10/16/2015 0456   HCT 40.9 01/31/2011 1409   PLT 151 10/16/2015 0456   PLT 221 01/31/2011 1409   MCV 93.4 10/16/2015 0456   MCV 91.7 01/31/2011 1409   NEUTROABS 14.8* 10/14/2015 0557   NEUTROABS 5.1 01/31/2011 1409   LYMPHSABS 2.7 10/14/2015 0557   LYMPHSABS 1.5 01/31/2011 1409   MONOABS 1.9* 10/14/2015 0557   MONOABS 0.7 01/31/2011 1409   EOSABS 0.0 10/14/2015 0557   EOSABS 0.2  01/31/2011 1409   BASOSABS 0.0 10/14/2015 0557  BASOSABS 0.0 01/31/2011 1409   Comprehensive Metabolic Panel:    Component Value Date/Time   NA 153* 10/16/2015 0456   NA 145 04/07/2014   K 4.9 10/16/2015 0456   CL 121* 10/16/2015 0456   CO2 19* 10/16/2015 0456   BUN 32* 10/16/2015 0456   BUN 16 04/07/2014   CREATININE 1.11* 10/16/2015 0456   CREATININE 0.6 04/07/2014   GLUCOSE 204* 10/16/2015 0456   CALCIUM 8.3* 10/16/2015 0456   AST 43* 10/16/2015 0456   ALT 26 10/16/2015 0456   ALKPHOS 49 10/16/2015 0456   BILITOT 2.3* 10/16/2015 0456   PROT 5.2* 10/16/2015 0456   ALBUMIN 2.0* 10/16/2015 0456     Time In: 1120 Time Out: 1240 Time Total: 71mn Greater than 50%  of this time was spent counseling and coordinating care related to the above assessment and plan.  Signed by: PPershing Proud NP  APershing Proud NP  12/64/1583 12:48 PM  Please contact Palliative Medicine Team phone at [phone] questions and concerns.

## 2015-10-16 NOTE — Progress Notes (Signed)
[facility]                                                                              Patient Demographics  Tamara Perez, is a 82 y.o. female, DOB - 13-Sep-1933, [id number]  Admit date - 10/14/2015   Admitting Physician Janece Canterbury, MD  Outpatient Primary MD for the patient is Hennie Duos, MD  LOS - 2   Chief Complaint  Patient presents with  . Altered Mental Status      HPI on 10/14/2015 by Ms. Sharene Butters, PA Tamara Perez is a 82 y.o. female with a history of Alzheimer's dementia, brought from her nursing home to the emergency department after found unresponsive at the facility. The history is provided by her family. She was brought early this morning with fevers initially at the range of 100.3, Tachycardia, hypoxia requiring up to 4 L of oxygen on arrival. Family states she had several UTIs in the past, the last one about 8 or 9 months ago, treated with oral antibiotics.She had a urinary sling placed in 2007 for incontinence. The patient is incontinent of stools and urine, requiring diapers. Family did notice that her oral intake and urinary output was decreased over the last 3-4 days prior to admission, she required only one diaper change. Family reports patient feeling weaker over the last few days, but without shortness of breath or wheezing. No cough or sputum production. No hemoptysis. No apparent nausea, vomiting, or diarrhea. No sick contacts.  At the ED UA was positive for TNC WBC, turbid urine. CXR was negative. Max temp was 103 F, lactic acid was at 2.61, heart rate up to 130, and blood pressure borderline in the low 100s, lactate 3.09. She received received 2 L of fluid bolus in the emergency department and antibiotics initiated with some improvement of her BP and Temp and O2 sats. She was found to have AKI as well, with Sodium 162, creatinine up to 2.39 and be you when of 81. She has mild transamminitis, with AST of 103 and AST of 53, bilirubin  1.3 . CBC was remarkable for leukocytosis, with a white count of 19 and hemoglobin of 17.9 in the setting of dehydration.  Assessment & Plan   Severe Sepsis secondary to urinary tract infection -Upon admission was febrile with tachycardia, tachypneic, leukocytosis -Lactic acid was 3.09, has improved to 1.9 -Continue IV fluid -UA positive for urinary tract infection, Urine culture >100K Proteus Mirabilis- pending sensitivities -Blood cultures show no growth to date -CT scan of the head: No acute intracranial abnormality -Chest x-ray: Mild left midlung atelectasis or scarring, otherwise clear -Continue cefepime  Acute kidney injury -Baseline creatinine appears 0.7, was 2.39 upon admission -Continue IV fluids -Currently 1.11, continue to monitor BMP  Diabetes mellitus, type II, diet-controlled -Hemoglobin A1c in 2015 was 7 -Was not on home medications -Continue insulin sliding scale CBG monitoring  Chronic diastolic heart failure, presumed -No echocardiogram in the system -Was on home lasix, held secondary to sepsis and AKI  Elevated troponin -Likely secondary to sepsis and demand ischemia -Troponin peak 0.13, currently trending downward  Elevated transaminases -Resolved, Likely secondary to sepsis -Continue to monitor CMP  Hypernatremia -  Sodium currently 153 -Continue D5 water and continue to monitor BMP closely  COPD/asthma -Stable, continue home medications: Pulmicort  Dementia -Aricept and Namenda held  Depression -Paxil held  Dyslipidemia -Statin held  Code Status: DNR  Family Communication: Daughters at bedside  Disposition Plan: Admitted, continue to monitor in stepdown. Pending Palliative Care  Time Spent in minutes   30 minutes  Procedures  None  Consults   None  DVT Prophylaxis  Lovenox  Lab Results  Component Value Date   PLT 151 10/16/2015    Medications  Scheduled Meds: . aspirin  300 mg Rectal Daily  . budesonide (PULMICORT)  nebulizer solution  0.25 mg Nebulization BID  . ceFEPime (MAXIPIME) IV  1 g Intravenous Q24H  . enoxaparin (LOVENOX) injection  30 mg Subcutaneous Q24H  . insulin aspart  0-9 Units Subcutaneous 6 times per day  . latanoprost  1 drop Both Eyes QHS  . sodium chloride flush  3 mL Intravenous Q12H   Continuous Infusions: . dextrose 1,000 mL (10/16/15 0249)   PRN Meds:.acetaminophen **OR** acetaminophen, metoprolol, ondansetron **OR** ondansetron (ZOFRAN) IV  Antibiotics    Anti-infectives    Start     Dose/Rate Route Frequency Ordered Stop   10/14/15 1200  ceFEPIme (MAXIPIME) 1 g in dextrose 5 % 50 mL IVPB     1 g 100 mL/hr over 30 Minutes Intravenous Every 24 hours 10/14/15 1044     10/14/15 0545  piperacillin-tazobactam (ZOSYN) IVPB 3.375 g     3.375 g 100 mL/hr over 30 Minutes Intravenous  Once 10/14/15 0544 10/14/15 0652   10/14/15 0545  vancomycin (VANCOCIN) IVPB 1000 mg/200 mL premix     1,000 mg 200 mL/hr over 60 Minutes Intravenous  Once 10/14/15 0544 10/14/15 [id number]      Subjective:   Tamara Perez seen and examined today.  Patient awake and confused (dementia).  She can answer some questions appropriately and follow commands.  Objective:   Filed Vitals:   10/16/15 0010 10/16/15 0400 10/16/15 0800 10/16/15 0834  BP: 102/52 113/56 122/55   Pulse: 72 59 51   Temp: 97.4 F (36.3 C) 97.6 F (36.4 C) 96.8 F (36 C)   TempSrc: Axillary Axillary Rectal   Resp: 23 15 18    Weight:      SpO2: 98% 100% 100% 100%    Wt Readings from Last 3 Encounters:  10/14/15 67 kg (147 lb 11.3 oz)  08/16/15 67.586 kg (149 lb)  05/25/15 70.308 kg (155 lb)     Intake/Output Summary (Last 24 hours) at 10/16/15 0926 Last data filed at 10/16/15 0515  Gross per 24 hour  Intake   2588 ml  Output      0 ml  Net   2588 ml    Exam  General: Well developed, thin, cachetic female, NAD  HEENT: NCAT, mucous membranes mildly moist   Cardiovascular: S1 S2 auscultated,  bradycardic  Respiratory: Clear to auscultation bilaterally   Abdomen: Soft, nontender, nondistended, + bowel sounds  Extremities: warm dry without cyanosis clubbing or edema  Neuro: AAOx1 (self and daughter). Cananswer many questions appropriately and follow commands.  Data Review   Micro Results Recent Results (from the past 240 hour(s))  Urine culture     Status: None (Preliminary result)   Collection Time: 10/14/15  5:50 AM  Result Value Ref Range Status   Specimen Description URINE, CATHETERIZED  Final   Special Requests NONE  Final   Culture >=100,000 COLONIES/mL PROTEUS MIRABILIS  Final  Report Status PENDING  Incomplete  Blood Culture (routine x 2)     Status: None (Preliminary result)   Collection Time: 10/14/15  5:57 AM  Result Value Ref Range Status   Specimen Description BLOOD RIGHT ARM  Final   Special Requests IN PEDIATRIC BOTTLE 3ML  Final   Culture NO GROWTH 1 DAY  Final   Report Status PENDING  Incomplete  Blood Culture (routine x 2)     Status: None (Preliminary result)   Collection Time: 10/14/15  6:03 AM  Result Value Ref Range Status   Specimen Description BLOOD RIGHT HAND  Final   Special Requests IN PEDIATRIC BOTTLE 1ML  Final   Culture NO GROWTH 1 DAY  Final   Report Status PENDING  Incomplete  MRSA PCR Screening     Status: None   Collection Time: 10/14/15 11:58 AM  Result Value Ref Range Status   MRSA by PCR NEGATIVE NEGATIVE Final    Comment:        The GeneXpert MRSA Assay (FDA approved for NASAL specimens only), is one component of a comprehensive MRSA colonization surveillance program. It is not intended to diagnose MRSA infection nor to guide or monitor treatment for MRSA infections.     Radiology Reports Ct Head Wo Contrast  10/15/2015  CLINICAL DATA:  Progressive dementia presenting with sepsis secondary to urinary tract infection. Seizures. EXAM: CT HEAD WITHOUT CONTRAST TECHNIQUE: Contiguous axial images were obtained from the  base of the skull through the vertex without intravenous contrast. COMPARISON:  MRI brain 02/20/2009 FINDINGS: Diffuse cerebral atrophy. Mild ventricular dilatation consistent with central atrophy. Low-attenuation changes in the deep white matter consistent with small vessel ischemia. No mass effect or midline shift. No abnormal extra-axial fluid collections. Gray-white matter junctions are distinct. Basal cisterns are not effaced. No evidence of acute intracranial hemorrhage. No depressed skull fractures. Visualized paranasal sinuses and mastoid air cells are not opacified. Vascular calcifications. IMPRESSION: No acute intracranial abnormalities. Chronic atrophy and small vessel ischemic changes. Electronically Signed   By: Lucienne Capers M.D.   On: 10/15/2015 03:38   Dg Chest Port 1 View  10/14/2015  CLINICAL DATA:  Code sepsis. Acute onset of fever. Initial encounter. EXAM: PORTABLE CHEST 1 VIEW COMPARISON:  Chest radiograph performed 11/29/2011 FINDINGS: The lungs are well-aerated. Mild left midlung atelectasis or scarring is noted. There is no evidence of pleural effusion or pneumothorax. The cardiomediastinal silhouette is borderline normal in size. No acute osseous abnormalities are seen. IMPRESSION: Mild left midlung atelectasis or scarring noted. Lungs otherwise clear. Electronically Signed   By: Garald Balding M.D.   On: 10/14/2015 06:16    CBC  Recent Labs Lab 10/14/15 0557 10/15/15 0407 10/16/15 0456  WBC 19.4* 14.4* 16.5*  HGB 17.9* 13.2 12.5  HCT 56.8* 44.4 39.7  PLT 321 211 151  MCV 93.9 98.2 93.4  MCH 29.6 29.2 29.4  MCHC 31.5 29.7* 31.5  RDW 15.5 15.6* 15.3  LYMPHSABS 2.7  --   --   MONOABS 1.9*  --   --   EOSABS 0.0  --   --   BASOSABS 0.0  --   --     Chemistries   Recent Labs Lab 10/14/15 0557 10/14/15 1430 10/15/15 0407 10/15/15 1653 10/16/15 0456  NA 162*  --  166* 160* 153*  K 4.8  --  3.1* 3.1* 4.9  CL 119*  --  129* 124* 121*  CO2 24  --  26 24 19*   GLUCOSE 278*  --  166* 310* 204*  BUN 79*  --  59* 48* 32*  CREATININE 2.39*  --  1.61* 1.38* 1.11*  CALCIUM 10.3  --  8.7* 8.6* 8.3*  MG  --  2.0  --   --   --   AST 103*  --  33  --  43*  ALT 53  --  34  --  26  ALKPHOS 74  --  57  --  49  BILITOT 1.3*  --  0.5  --  2.3*   ------------------------------------------------------------------------------------------------------------------ estimated creatinine clearance is 33.6 mL/min (by C-G formula based on Cr of 1.11). ------------------------------------------------------------------------------------------------------------------ No results for input(s): HGBA1C in the last 72 hours. ------------------------------------------------------------------------------------------------------------------ No results for input(s): CHOL, HDL, LDLCALC, TRIG, CHOLHDL, LDLDIRECT in the last 72 hours. ------------------------------------------------------------------------------------------------------------------ No results for input(s): TSH, T4TOTAL, T3FREE, THYROIDAB in the last 72 hours.  Invalid input(s): FREET3 ------------------------------------------------------------------------------------------------------------------ No results for input(s): VITAMINB12, FOLATE, FERRITIN, TIBC, IRON, RETICCTPCT in the last 72 hours.  Coagulation profile  Recent Labs Lab 10/14/15 1111  INR 1.56*    No results for input(s): DDIMER in the last 72 hours.  Cardiac Enzymes  Recent Labs Lab 10/14/15 1111 10/14/15 1430 10/14/15 2155  TROPONINI 0.13* 0.11* 0.10*   ------------------------------------------------------------------------------------------------------------------ Invalid input(s): POCBNP    Fayetta Sorenson D.O. on 10/16/2015 at 9:26 AM  Between 7am to 7pm - Pager - [phone]  After 7pm go to [email or web address] - password [email or web address]  And look for the night coverage person covering for me after hours  [facility] Office   [phone]

## 2015-10-16 NOTE — Progress Notes (Signed)
Speech Language Pathology Dysphagia Treatment Patient Details Name: Tamara Perez MRN: [id number] DOB: June 02, 1933 Today's Date: 10/16/2015 Time: 1859-0931 SLP Time Calculation (min) (ACUTE ONLY): 16 min  Assessment / Plan / Recommendation Clinical Impression    SLP provided skilled observation of upgraded POs to advise diet advancement and safety. Pallative consulted to determine goals of care, therefore daughter asked for diet to be advanced for comfort care if pt appropriate. No overt s/s of penetration/aspiration observed. Pt demonstrated poor bolus awareness and reduced bolus cohesion during graham cracker intake, however functional for mechanical soft intake. Increased labial seal and timely initiation of the pharyngeal swallow was observed with thin liquid via straw. Daughters educated re: diet recommendation. Recommend Dysphagia 3 diet and thin liquids via straw. No further SLP f/u is warranted at this time.    Diet Recommendation    Dysphagia 3 diet, thin liquids via straw   SLP Plan All goals met      Swallowing Goals     General HPI: 82 year old female with history of advanced Alzheimer's dementia, bedridden for the last year who requires assistance with meals, recurrent UTI who presented from SNF with decreased urine, PO intake, low-grade fevers and progressive lethargy. CXR without signs of acute infection. PTA, daughters report that pt was on a chopped diet and thin liquids.  Oral Cavity - Oral Hygiene     Dysphagia Treatment Family/Caregiver Educated: 2 daughters Treatment Methods: Skilled observation;Upgraded PO texture trial;Patient/caregiver education Patient observed directly with PO's: Yes Type of PO's observed: Regular;Dysphagia 1 (puree);Thin liquids Feeding: Total assist Liquids provided via: Cup;Straw Oral Phase Signs & Symptoms: Prolonged bolus formation Type of cueing: Verbal Amount of cueing: Moderate   GO     Linda Biehn 10/16/2015, 10:59  AM

## 2015-10-16 NOTE — Progress Notes (Signed)
IV team paged, pt needs IV restart and daughter who is POA stated that she wants her mom to be made full comfort care and does not want IV. Daughter made aware that pt could need the IV for comfort measures. Spoke with Dr Ree Kida and Palliative about changes, they stated they would come and see family today.

## 2015-10-16 NOTE — Progress Notes (Signed)
Palliative:  Full note to follow. Full comfort care. Family does not want further IV or lab sticks. Comfort prns added. Transfer to regular floor for comfort.  Vinie Sill, NP Palliative Medicine Team Pager # [phone] (M-F 8a-5p) Team Phone # [phone] (Nights/Weekends)

## 2015-10-16 NOTE — Progress Notes (Signed)
Pt transferred from [facility] to 6N via bed.  Pt on RA.  Pt has n IV access.  Pt has family to bedside.  Report rcvd from [employer], Therapist, sports.

## 2015-10-17 DIAGNOSIS — F0281 Dementia in other diseases classified elsewhere with behavioral disturbance: Secondary | ICD-10-CM

## 2015-10-17 DIAGNOSIS — Z515 Encounter for palliative care: Secondary | ICD-10-CM | POA: Insufficient documentation

## 2015-10-17 DIAGNOSIS — G308 Other Alzheimer's disease: Secondary | ICD-10-CM

## 2015-10-17 MED ORDER — HALOPERIDOL LACTATE 2 MG/ML PO CONC
2.0000 mg | Freq: Every day | ORAL | Status: DC
  Filled 2015-10-17 (×2): qty 1

## 2015-10-17 NOTE — Progress Notes (Signed)
[facility]                                                                           Patient Demographics  Tamara Perez, is a 82 y.o. female, DOB - 1932-12-10, [id number]  Admit date - 10/14/2015   Admitting Physician Janece Canterbury, MD  Outpatient Primary MD for the patient is Hennie Duos, MD  LOS - 3   Chief Complaint  Patient presents with  . Altered Mental Status      HPI on 10/14/2015 by Ms. Sharene Butters, PA CHARYL TOMASSETTI is a 82 y.o. female with a history of Alzheimer's dementia, brought from her nursing home to the emergency department after found unresponsive at the facility. The history is provided by her family. She was brought early this morning with fevers initially at the range of 100.3, Tachycardia, hypoxia requiring up to 4 L of oxygen on arrival. Family states she had several UTIs in the past, the last one about 8 or 9 months ago, treated with oral antibiotics.She had a urinary sling placed in 2007 for incontinence. The patient is incontinent of stools and urine, requiring diapers. Family did notice that her oral intake and urinary output was decreased over the last 3-4 days prior to admission, she required only one diaper change. Family reports patient feeling weaker over the last few days, but without shortness of breath or wheezing. No cough or sputum production. No hemoptysis. No apparent nausea, vomiting, or diarrhea. No sick contacts.  At the ED UA was positive for TNC WBC, turbid urine. CXR was negative. Max temp was 103 F, lactic acid was at 2.61, heart rate up to 130, and blood pressure borderline in the low 100s, lactate 3.09. She received received 2 L of fluid bolus in the emergency department and antibiotics initiated with some improvement of her BP and Temp and O2 sats. She was found to have AKI as well, with Sodium 162, creatinine up to 2.39 and be you when of 81. She has mild transamminitis, with AST of 103 and AST of 53, bilirubin 1.3 .  CBC was remarkable for leukocytosis, with a white count of 19 and hemoglobin of 17.9 in the setting of dehydration.  Interim history Palliative care was consulted and family decided to transition to comfort care. Pending [facility]/residential hospice.  Assessment & Plan   Severe Sepsis secondary to urinary tract infection -Upon admission was febrile with tachycardia, tachypneic, leukocytosis -Lactic acid was 3.09, has improved to 1.9 -UA positive for urinary tract infection, Urine culture >100K Proteus Mirabilis -Blood cultures show no growth to date -CT scan of the head: No acute intracranial abnormality -Chest x-ray: Mild left midlung atelectasis or scarring, otherwise clear -antibiotics discontinued as patient transitioned to comfort care  Acute kidney injury -Baseline creatinine appears 0.7, was 2.39 upon admission -Last Cr 1.11 -No further lab draws  Diabetes mellitus, type II, diet-controlled -Hemoglobin A1c in 2015 was 7 -Was not on home medications -Discontinued ISS  Chronic diastolic heart failure, presumed -No echocardiogram in the system -Was on home lasix, held secondary to sepsis and AKI  Elevated troponin -Likely secondary to sepsis and demand ischemia -Troponin peak 0.13, was trending downward  Elevated transaminases -  Resolved, Likely secondary to sepsis  Hypernatremia -Sodium currently 153 -No further lab draws  COPD/asthma -Stable  Dementia -Aricept and Namenda held  Depression -Paxil held  Dyslipidemia -Statin held  Goals of care -palliative care consulted -patient transition to full comfort care and currently pending residential hospice   Code Status: DNR/Comfort care  Family Communication: Daughters at bedside  Disposition Plan: Admitted, pending residential hospice  Time Spent in minutes   30 minutes  Procedures  None  Consults   Palliative care  DVT Prophylaxis  None  Lab Results  Component Value Date   PLT 151  10/16/2015    Medications  Scheduled Meds: . budesonide (PULMICORT) nebulizer solution  0.25 mg Nebulization BID  . latanoprost  1 drop Both Eyes QHS  . PARoxetine  20 mg Oral Daily  . sodium chloride flush  3 mL Intravenous Q12H   Continuous Infusions:   PRN Meds:.acetaminophen **OR** acetaminophen, antiseptic oral rinse, glycopyrrolate **OR** glycopyrrolate **OR** glycopyrrolate, haloperidol, LORazepam, morphine CONCENTRATE, ondansetron **OR** ondansetron (ZOFRAN) IV, polyvinyl alcohol  Antibiotics    Anti-infectives    Start     Dose/Rate Route Frequency Ordered Stop   10/14/15 1200  ceFEPIme (MAXIPIME) 1 g in dextrose 5 % 50 mL IVPB  Status:  Discontinued     1 g 100 mL/hr over 30 Minutes Intravenous Every 24 hours 10/14/15 1044 10/16/15 1223   10/14/15 0545  piperacillin-tazobactam (ZOSYN) IVPB 3.375 g     3.375 g 100 mL/hr over 30 Minutes Intravenous  Once 10/14/15 0544 10/14/15 0652   10/14/15 0545  vancomycin (VANCOCIN) IVPB 1000 mg/200 mL premix     1,000 mg 200 mL/hr over 60 Minutes Intravenous  Once 10/14/15 0544 10/14/15 [id number]      Subjective:   [name] seen and examined today.  Patient awake and confused (dementia).  She can answer some questions appropriately and follow commands.  Objective:   Filed Vitals:   10/16/15 1240 10/16/15 1500 10/17/15 0422 10/17/15 0734  BP: 89/69 101/50 112/51   Pulse: 56 214 71   Temp: 97.1 F (36.2 C)  98.4 F (36.9 C)   TempSrc: Axillary  Oral   Resp: 17 24 20    Weight:      SpO2: 95% 91% 97% 94%    Wt Readings from Last 3 Encounters:  10/14/15 67 kg (147 lb 11.3 oz)  08/16/15 67.586 kg (149 lb)  05/25/15 70.308 kg (155 lb)    No intake or output data in the 24 hours ending 10/17/15 1129  Exam  General: Well developed, thin, cachetic female, NAD  HEENT: NCAT, mucous membranes mildly moist   Cardiovascular: S1 S2 auscultated, bradycardic  Respiratory: Clear to auscultation bilaterally   Abdomen:  Soft, nontender, nondistended, + bowel sounds  Extremities: warm dry without cyanosis clubbing or edema  Data Review   Micro Results Recent Results (from the past 240 hour(s))  Urine culture     Status: None   Collection Time: 10/14/15  5:50 AM  Result Value Ref Range Status   Specimen Description URINE, CATHETERIZED  Final   Special Requests NONE  Final   Culture >=100,000 COLONIES/mL PROTEUS MIRABILIS  Final   Report Status 10/16/2015 FINAL  Final   Organism ID, Bacteria PROTEUS MIRABILIS  Final      Susceptibility   Proteus mirabilis - MIC*    AMPICILLIN <=2 SENSITIVE Sensitive     CEFAZOLIN <=4 SENSITIVE Sensitive     CEFTRIAXONE <=1 SENSITIVE Sensitive     CIPROFLOXACIN >=4  RESISTANT Resistant     GENTAMICIN <=1 SENSITIVE Sensitive     IMIPENEM <=0.25 SENSITIVE Sensitive     NITROFURANTOIN 256 RESISTANT Resistant     TRIMETH/SULFA <=20 SENSITIVE Sensitive     AMPICILLIN/SULBACTAM <=2 SENSITIVE Sensitive     * >=100,000 COLONIES/mL PROTEUS MIRABILIS  Blood Culture (routine x 2)     Status: None (Preliminary result)   Collection Time: 10/14/15  5:57 AM  Result Value Ref Range Status   Specimen Description BLOOD RIGHT ARM  Final   Special Requests IN PEDIATRIC BOTTLE 3ML  Final   Culture NO GROWTH 2 DAYS  Final   Report Status PENDING  Incomplete  Blood Culture (routine x 2)     Status: None (Preliminary result)   Collection Time: 10/14/15  6:03 AM  Result Value Ref Range Status   Specimen Description BLOOD RIGHT HAND  Final   Special Requests IN PEDIATRIC BOTTLE 1ML  Final   Culture NO GROWTH 2 DAYS  Final   Report Status PENDING  Incomplete  MRSA PCR Screening     Status: None   Collection Time: 10/14/15 11:58 AM  Result Value Ref Range Status   MRSA by PCR NEGATIVE NEGATIVE Final    Comment:        The GeneXpert MRSA Assay (FDA approved for NASAL specimens only), is one component of a comprehensive MRSA colonization surveillance program. It is not intended to  diagnose MRSA infection nor to guide or monitor treatment for MRSA infections.     Radiology Reports Ct Head Wo Contrast  10/15/2015  CLINICAL DATA:  Progressive dementia presenting with sepsis secondary to urinary tract infection. Seizures. EXAM: CT HEAD WITHOUT CONTRAST TECHNIQUE: Contiguous axial images were obtained from the base of the skull through the vertex without intravenous contrast. COMPARISON:  MRI brain 02/20/2009 FINDINGS: Diffuse cerebral atrophy. Mild ventricular dilatation consistent with central atrophy. Low-attenuation changes in the deep white matter consistent with small vessel ischemia. No mass effect or midline shift. No abnormal extra-axial fluid collections. Gray-white matter junctions are distinct. Basal cisterns are not effaced. No evidence of acute intracranial hemorrhage. No depressed skull fractures. Visualized paranasal sinuses and mastoid air cells are not opacified. Vascular calcifications. IMPRESSION: No acute intracranial abnormalities. Chronic atrophy and small vessel ischemic changes. Electronically Signed   By: Lucienne Capers M.D.   On: 10/15/2015 03:38   Dg Chest Port 1 View  10/14/2015  CLINICAL DATA:  Code sepsis. Acute onset of fever. Initial encounter. EXAM: PORTABLE CHEST 1 VIEW COMPARISON:  Chest radiograph performed 11/29/2011 FINDINGS: The lungs are well-aerated. Mild left midlung atelectasis or scarring is noted. There is no evidence of pleural effusion or pneumothorax. The cardiomediastinal silhouette is borderline normal in size. No acute osseous abnormalities are seen. IMPRESSION: Mild left midlung atelectasis or scarring noted. Lungs otherwise clear. Electronically Signed   By: Garald Balding M.D.   On: 10/14/2015 06:16    CBC  Recent Labs Lab 10/14/15 0557 10/15/15 0407 10/16/15 0456  WBC 19.4* 14.4* 16.5*  HGB 17.9* 13.2 12.5  HCT 56.8* 44.4 39.7  PLT 321 211 151  MCV 93.9 98.2 93.4  MCH 29.6 29.2 29.4  MCHC 31.5 29.7* 31.5  RDW  15.5 15.6* 15.3  LYMPHSABS 2.7  --   --   MONOABS 1.9*  --   --   EOSABS 0.0  --   --   BASOSABS 0.0  --   --     Chemistries   Recent Labs Lab 10/14/15 0557 10/14/15  1430 10/15/15 0407 10/15/15 1653 10/16/15 0456  NA 162*  --  166* 160* 153*  K 4.8  --  3.1* 3.1* 4.9  CL 119*  --  129* 124* 121*  CO2 24  --  26 24 19*  GLUCOSE 278*  --  166* 310* 204*  BUN 79*  --  59* 48* 32*  CREATININE 2.39*  --  1.61* 1.38* 1.11*  CALCIUM 10.3  --  8.7* 8.6* 8.3*  MG  --  2.0  --   --   --   AST 103*  --  33  --  43*  ALT 53  --  34  --  26  ALKPHOS 74  --  57  --  49  BILITOT 1.3*  --  0.5  --  2.3*   ------------------------------------------------------------------------------------------------------------------ estimated creatinine clearance is 33.6 mL/min (by C-G formula based on Cr of 1.11). ------------------------------------------------------------------------------------------------------------------ No results for input(s): HGBA1C in the last 72 hours. ------------------------------------------------------------------------------------------------------------------ No results for input(s): CHOL, HDL, LDLCALC, TRIG, CHOLHDL, LDLDIRECT in the last 72 hours. ------------------------------------------------------------------------------------------------------------------ No results for input(s): TSH, T4TOTAL, T3FREE, THYROIDAB in the last 72 hours.  Invalid input(s): FREET3 ------------------------------------------------------------------------------------------------------------------ No results for input(s): VITAMINB12, FOLATE, FERRITIN, TIBC, IRON, RETICCTPCT in the last 72 hours.  Coagulation profile  Recent Labs Lab 10/14/15 1111  INR 1.56*    No results for input(s): DDIMER in the last 72 hours.  Cardiac Enzymes  Recent Labs Lab 10/14/15 1111 10/14/15 1430 10/14/15 2155  TROPONINI 0.13* 0.11* 0.10*    ------------------------------------------------------------------------------------------------------------------ Invalid input(s): POCBNP    Jebadiah Imperato D.O. on 10/17/2015 at 11:29 AM  Between 7am to 7pm - Pager - [phone]  After 7pm go to [email or web address] - password [email or web address]  And look for the night coverage person covering for me after hours  [facility] Office  [phone]

## 2015-10-17 NOTE — Progress Notes (Signed)
Nutrition Brief Note  Chart reviewed. Pt now transitioning to comfort care.  No further nutrition interventions warranted at this time.  Please re-consult as needed.   Darlys Buis A. Faye Sanfilippo, RD, LDN, CDE Pager: [phone] After hours Pager: [phone]  

## 2015-10-17 NOTE — Progress Notes (Signed)
CSW contacted Harmon Pier from [employer] regarding family's preference for their facility. Per Harmon Pier, she will evaluate the patient on today for possible placement on tomorrow. She will keep in contact with CSW if anything changes.   CSW will continue to follow and provide support to patient and family while in [place], [place] Worker [facility] Ph: [phone]

## 2015-10-18 MED ORDER — MORPHINE SULFATE (CONCENTRATE) 10 MG/0.5ML PO SOLN: 5.0000 mg | ORAL | Status: AC | PRN

## 2015-10-18 MED ORDER — HALOPERIDOL LACTATE 2 MG/ML PO CONC: 1.0000 mg | Freq: Four times a day (QID) | ORAL | Status: AC | PRN

## 2015-10-18 MED ORDER — GLYCOPYRROLATE 1 MG PO TABS: 1.0000 mg | ORAL_TABLET | ORAL | Status: AC | PRN

## 2015-10-18 MED ORDER — LORAZEPAM 0.5 MG PO TABS: 0.5000 mg | ORAL_TABLET | Freq: Four times a day (QID) | ORAL | Status: AC | PRN

## 2015-10-18 NOTE — Progress Notes (Signed)
Patient transferred to [facility] for [facility].  Patient and Family aware and prepared for transfer.  Report called to [facility].

## 2015-10-18 NOTE — Discharge Summary (Signed)
Physician Discharge Summary  Tamara Perez [id number] DOB: 1933/02/15 DOA: 10/14/2015  PCP: Hennie Duos, MD  Admit date: 10/14/2015 Discharge date: 10/18/2015  Time spent: 45 minutes  Recommendations for Outpatient Follow-up:  Patient will be discharged to [facility], hospice.  Patient will need to follow up with primary care provider within one week of discharge if needed.  Patient should continue medications as prescribed.  Patient should follow a dysphagia 1 diet.   Discharge Diagnoses:  Severe Sepsis secondary to urinary tract infection Acute kidney injury Diabetes mellitus, type II, diet-controlled Chronic diastolic heart failure, presumed Elevated troponin Elevated transaminases Hypernatremia COPD/asthma Dementia Depression Dyslipidemia Goals of care  Discharge Condition: Stable  Diet recommendation: Dysphagia 1  Filed Weights   10/14/15 0545  Weight: 67 kg (147 lb 11.3 oz)    History of present illness:  on 10/14/2015 by Ms. Tamara Butters, PA Tamara Perez is a 82 y.o. female with a history of Alzheimer's dementia, brought from her nursing home to the emergency department after found unresponsive at the facility. The history is provided by her family. She was brought early this morning with fevers initially at the range of 100.3, Tachycardia, hypoxia requiring up to 4 L of oxygen on arrival. Family states she had several UTIs in the past, the last one about 8 or 9 months ago, treated with oral antibiotics.She had a urinary sling placed in 2007 for incontinence. The patient is incontinent of stools and urine, requiring diapers. Family did notice that her oral intake and urinary output was decreased over the last 3-4 days prior to admission, she required only one diaper change. Family reports patient feeling weaker over the last few days, but without shortness of breath or wheezing. No cough or sputum production. No hemoptysis. No apparent nausea, vomiting,  or diarrhea. No sick contacts.  At the ED UA was positive for TNC WBC, turbid urine. CXR was negative. Max temp was 103 F, lactic acid was at 2.61, heart rate up to 130, and blood pressure borderline in the low 100s, lactate 3.09. She received received 2 L of fluid bolus in the emergency department and antibiotics initiated with some improvement of her BP and Temp and O2 sats. She was found to have AKI as well, with Sodium 162, creatinine up to 2.39 and be you when of 81. She has mild transamminitis, with AST of 103 and AST of 53, bilirubin 1.3 . CBC was remarkable for leukocytosis, with a white count of 19 and hemoglobin of 17.9 in the setting of dehydration.  Hospital Course:  Severe Sepsis secondary to urinary tract infection -Upon admission was febrile with tachycardia, tachypneic, leukocytosis -Lactic acid was 3.09, has improved to 1.9 -UA positive for urinary tract infection, Urine culture >100K Proteus Mirabilis -Blood cultures show no growth to date -CT scan of the head: No acute intracranial abnormality -Chest x-ray: Mild left midlung atelectasis or scarring, otherwise clear -antibiotics discontinued as patient transitioned to comfort care  Acute kidney injury -Baseline creatinine appears 0.7, was 2.39 upon admission -Last Cr 1.11 -No further lab draws  Diabetes mellitus, type II, diet-controlled -Hemoglobin A1c in 2015 was 7 -Was not on home medications -Discontinued ISS  Chronic diastolic heart failure, presumed -No echocardiogram in the system -Was on home lasix, held secondary to sepsis and AKI  Elevated troponin -Likely secondary to sepsis and demand ischemia -Troponin peak 0.13, was trending downward  Elevated transaminases -Resolved, Likely secondary to sepsis  Hypernatremia -Sodium currently 153 -No further lab  draws  COPD/asthma -Stable  Dementia -Aricept and Namenda held  Depression -Paxil held  Dyslipidemia -Statin held  Goals of  care -palliative care consulted -patient transition to full comfort care and currently pending residential hospice   Code Status: DNR/Comfort care  Procedures  None  Consults  Palliative care  Discharge Exam: Filed Vitals:   10/17/15 0422 10/18/15 0500  BP: 112/51 103/51  Pulse: 71 71  Temp: 98.4 F (36.9 C) 98 F (36.7 C)  Resp: 20 18   Exam  General: Well developed, thin, cachetic female, NAD  HEENT: NCAT, mucous membranes moist  Cardiovascular: S1 S2 auscultated, RRR  Respiratory: Clear to auscultation bilaterally  Abdomen: Soft, nontender, nondistended, + bowel sounds  Extremities: warm dry without cyanosis clubbing or edema  Neuro: AAOx1 (self), nonfocal  Psych: pleasant  Discharge Instructions      Discharge Instructions    Discharge instructions    Complete by:  As directed   Patient will be discharged to [facility], hospice.  Patient will need to follow up with primary care provider within one week of discharge if needed.  Patient should continue medications as prescribed.  Patient should follow a dysphagia 1 diet.            Medication List    STOP taking these medications        aspirin 81 MG tablet     atorvastatin 10 MG tablet  Commonly known as:  LIPITOR     beclomethasone 80 MCG/ACT inhaler  Commonly known as:  QVAR     Calcium Carbonate-Vitamin D 600-400 MG-UNIT tablet  Commonly known as:  CALTRATE 600+D     Cranberry 450 MG Caps     donepezil 10 MG tablet  Commonly known as:  ARICEPT     furosemide 20 MG tablet  Commonly known as:  LASIX     gabapentin 300 MG capsule  Commonly known as:  NEURONTIN     NAMENDA XR 28 MG Cp24 24 hr capsule  Generic drug:  memantine     vitamin C 500 MG tablet  Commonly known as:  ASCORBIC ACID     Vitamin D3 1000 units Caps      TAKE these medications        glycopyrrolate 1 MG tablet  Commonly known as:  ROBINUL  Take 1 tablet (1 mg total) by mouth every 4 (four) hours as  needed (excessive secretions).     haloperidol 2 MG/ML solution  Commonly known as:  HALDOL  Take 0.5-1 mLs (1-2 mg total) by mouth every 6 (six) hours as needed for agitation (delirium).     LORazepam 0.5 MG tablet  Commonly known as:  ATIVAN  Place 1 tablet (0.5 mg total) under the tongue every 6 (six) hours as needed for anxiety.     morphine CONCENTRATE 10 MG/0.5ML Soln concentrated solution  Take 0.25 mLs (5 mg total) by mouth every hour as needed for moderate pain or shortness of breath (dyspnea, tachypnea).     PARoxetine 20 MG tablet  Commonly known as:  PAXIL  Take 1 tablet (20 mg total) by mouth every morning.     Travoprost (BAK Free) 0.004 % Soln ophthalmic solution  Commonly known as:  TRAVATAN  Place 1 drop into both eyes at bedtime.       Allergies  Allergen Reactions  . Codeine Other (See Comments)    Reaction unknown  . Tuberculin Tests       The results of significant  diagnostics from this hospitalization (including imaging, microbiology, ancillary and laboratory) are listed below for reference.    Significant Diagnostic Studies: Ct Head Wo Contrast  10/15/2015  CLINICAL DATA:  Progressive dementia presenting with sepsis secondary to urinary tract infection. Seizures. EXAM: CT HEAD WITHOUT CONTRAST TECHNIQUE: Contiguous axial images were obtained from the base of the skull through the vertex without intravenous contrast. COMPARISON:  MRI brain 02/20/2009 FINDINGS: Diffuse cerebral atrophy. Mild ventricular dilatation consistent with central atrophy. Low-attenuation changes in the deep white matter consistent with small vessel ischemia. No mass effect or midline shift. No abnormal extra-axial fluid collections. Gray-white matter junctions are distinct. Basal cisterns are not effaced. No evidence of acute intracranial hemorrhage. No depressed skull fractures. Visualized paranasal sinuses and mastoid air cells are not opacified. Vascular calcifications. IMPRESSION:  No acute intracranial abnormalities. Chronic atrophy and small vessel ischemic changes. Electronically Signed   By: Lucienne Capers M.D.   On: 10/15/2015 03:38   Dg Chest Port 1 View  10/14/2015  CLINICAL DATA:  Code sepsis. Acute onset of fever. Initial encounter. EXAM: PORTABLE CHEST 1 VIEW COMPARISON:  Chest radiograph performed 11/29/2011 FINDINGS: The lungs are well-aerated. Mild left midlung atelectasis or scarring is noted. There is no evidence of pleural effusion or pneumothorax. The cardiomediastinal silhouette is borderline normal in size. No acute osseous abnormalities are seen. IMPRESSION: Mild left midlung atelectasis or scarring noted. Lungs otherwise clear. Electronically Signed   By: Garald Balding M.D.   On: 10/14/2015 06:16    Microbiology: Recent Results (from the past 240 hour(s))  Urine culture     Status: None   Collection Time: 10/14/15  5:50 AM  Result Value Ref Range Status   Specimen Description URINE, CATHETERIZED  Final   Special Requests NONE  Final   Culture >=100,000 COLONIES/mL PROTEUS MIRABILIS  Final   Report Status 10/16/2015 FINAL  Final   Organism ID, Bacteria PROTEUS MIRABILIS  Final      Susceptibility   Proteus mirabilis - MIC*    AMPICILLIN <=2 SENSITIVE Sensitive     CEFAZOLIN <=4 SENSITIVE Sensitive     CEFTRIAXONE <=1 SENSITIVE Sensitive     CIPROFLOXACIN >=4 RESISTANT Resistant     GENTAMICIN <=1 SENSITIVE Sensitive     IMIPENEM <=0.25 SENSITIVE Sensitive     NITROFURANTOIN 256 RESISTANT Resistant     TRIMETH/SULFA <=20 SENSITIVE Sensitive     AMPICILLIN/SULBACTAM <=2 SENSITIVE Sensitive     * >=100,000 COLONIES/mL PROTEUS MIRABILIS  Blood Culture (routine x 2)     Status: None (Preliminary result)   Collection Time: 10/14/15  5:57 AM  Result Value Ref Range Status   Specimen Description BLOOD RIGHT ARM  Final   Special Requests IN PEDIATRIC BOTTLE 3ML  Final   Culture NO GROWTH 3 DAYS  Final   Report Status PENDING  Incomplete  Blood  Culture (routine x 2)     Status: None (Preliminary result)   Collection Time: 10/14/15  6:03 AM  Result Value Ref Range Status   Specimen Description BLOOD RIGHT HAND  Final   Special Requests IN PEDIATRIC BOTTLE 1ML  Final   Culture NO GROWTH 3 DAYS  Final   Report Status PENDING  Incomplete  MRSA PCR Screening     Status: None   Collection Time: 10/14/15 11:58 AM  Result Value Ref Range Status   MRSA by PCR NEGATIVE NEGATIVE Final    Comment:        The GeneXpert MRSA Assay (FDA approved for NASAL  specimens only), is one component of a comprehensive MRSA colonization surveillance program. It is not intended to diagnose MRSA infection nor to guide or monitor treatment for MRSA infections.      Labs: Basic Metabolic Panel:  Recent Labs Lab 10/14/15 0557 10/14/15 1430 10/15/15 0407 10/15/15 1653 10/16/15 0456  NA 162*  --  166* 160* 153*  K 4.8  --  3.1* 3.1* 4.9  CL 119*  --  129* 124* 121*  CO2 24  --  26 24 19*  GLUCOSE 278*  --  166* 310* 204*  BUN 79*  --  59* 48* 32*  CREATININE 2.39*  --  1.61* 1.38* 1.11*  CALCIUM 10.3  --  8.7* 8.6* 8.3*  MG  --  2.0  --   --   --   PHOS  --  4.4  --   --   --    Liver Function Tests:  Recent Labs Lab 10/14/15 0557 10/15/15 0407 10/16/15 0456  AST 103* 33 43*  ALT 53 34 26  ALKPHOS 74 57 49  BILITOT 1.3* 0.5 2.3*  PROT 8.7* 6.3* 5.2*  ALBUMIN 3.0* 2.4* 2.0*   No results for input(s): LIPASE, AMYLASE in the last 168 hours. No results for input(s): AMMONIA in the last 168 hours. CBC:  Recent Labs Lab 10/14/15 0557 10/15/15 0407 10/16/15 0456  WBC 19.4* 14.4* 16.5*  NEUTROABS 14.8*  --   --   HGB 17.9* 13.2 12.5  HCT 56.8* 44.4 39.7  MCV 93.9 98.2 93.4  PLT 321 211 151   Cardiac Enzymes:  Recent Labs Lab 10/14/15 1111 10/14/15 1430 10/14/15 2155  TROPONINI 0.13* 0.11* 0.10*   BNP: BNP (last 3 results) No results for input(s): BNP in the last 8760 hours.  ProBNP (last 3 results) No results  for input(s): PROBNP in the last 8760 hours.  CBG:  Recent Labs Lab 10/15/15 2009 10/16/15 0010 10/16/15 0343 10/16/15 0838 10/16/15 1150  GLUCAP 305* 232* 181* 159* 117*       Signed:  Zyon Rosser  [facility] 10/18/2015, 7:59 AM

## 2015-10-18 NOTE — Progress Notes (Signed)
Patient will discharge to [facility] Anticipated discharge date:2/1 Family notified: at bedside Transportation by [place]- called at 9:50am  CSW signing off.  Domenica Reamer, [place] Social Worker [phone]

## 2015-10-18 NOTE — Consult Note (Signed)
[facility] Liaison: Received request 10/17/15 from [place] for family interest in [facility]. Room is available today 10/18/15. Spoke with daughter by phone multiple times 10/17/15 to answer questions and confirm desire to transfer. Plan to meet her at hospital at 8:30 this morning to complete paper work if she would like to transfer today. Daughter hopes to speak with physician this morning. Will update CSW this morning.   Will need discharge summary faxed to [phone].  RN please call report to [phone].  Thank you. Erling Conte, [place]

## 2015-10-19 LAB — CULTURE, BLOOD (ROUTINE X 2)
CULTURE: NO GROWTH
Culture: NO GROWTH

## 2015-11-15 DEATH — deceased

## 2016-05-01 IMAGING — CT CT HEAD W/O CM
2 series · 15 of 30 positions shown, 17 images · non-contrast
Comparison: MRI brain 02/20/2009

CLINICAL DATA: Progressive dementia presenting with sepsis
secondary to urinary tract infection. Seizures.

EXAM:
CT HEAD WITHOUT CONTRAST
TECHNIQUE: Contiguous axial images were obtained from the base of the skull
through the vertex without intravenous contrast.

[Series 2: head without · axial · non-contrast · 0.42mm/px · z∈[-225,-105]mm · 7 of 33 slices shown, 9 images]
[im 5/33  brain]
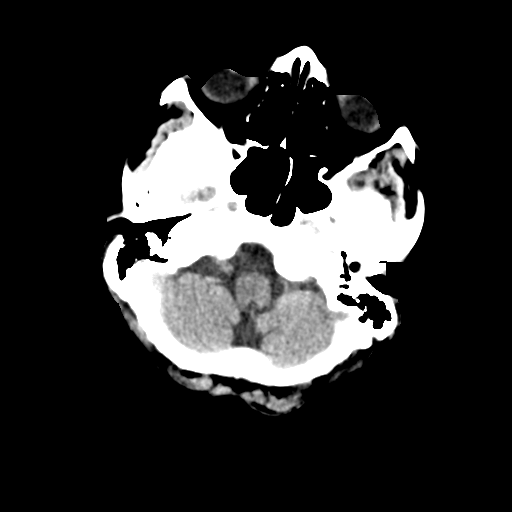
[im 5/33  bone]
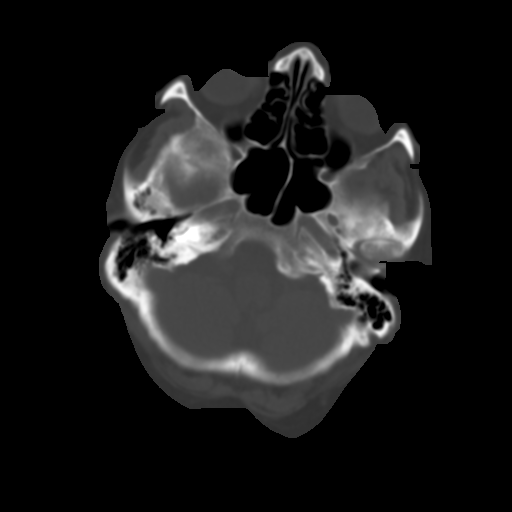
[im 9/33  brain]
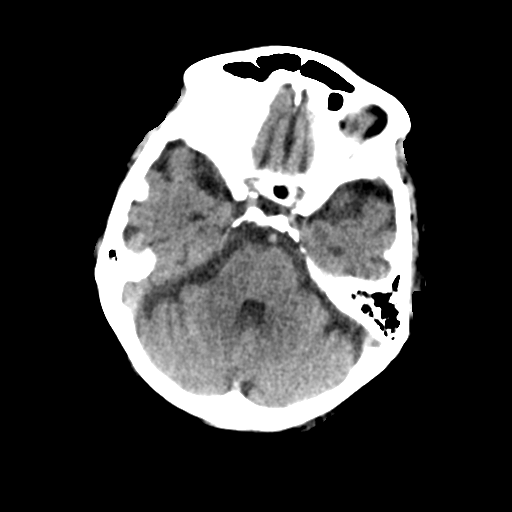
[im 13/33  brain]
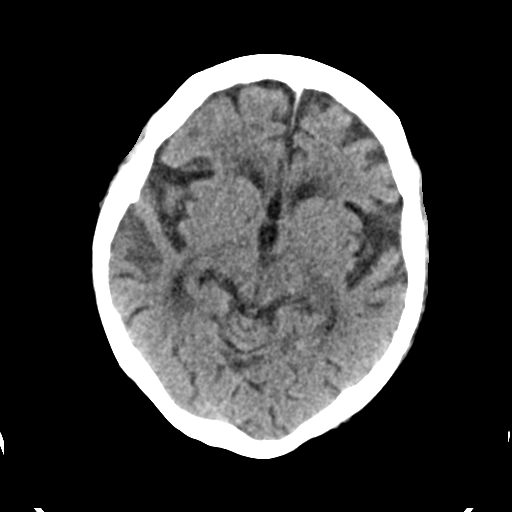
[im 17/33  brain]
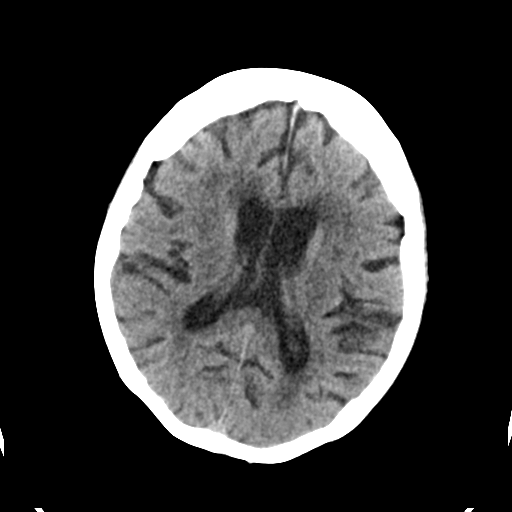
[im 21/33  brain]
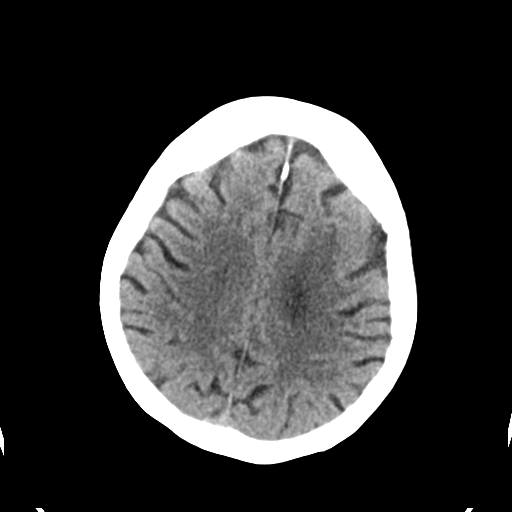
[im 21/33  bone]
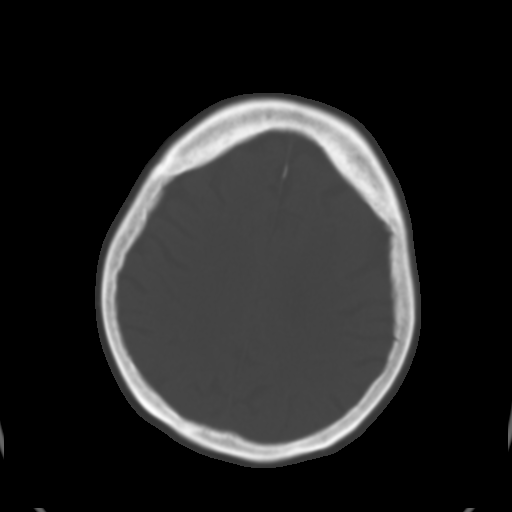
[im 25/33  brain]
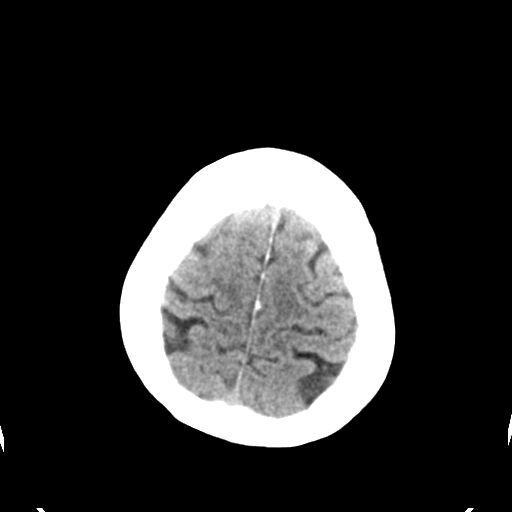
[im 29/33  brain]
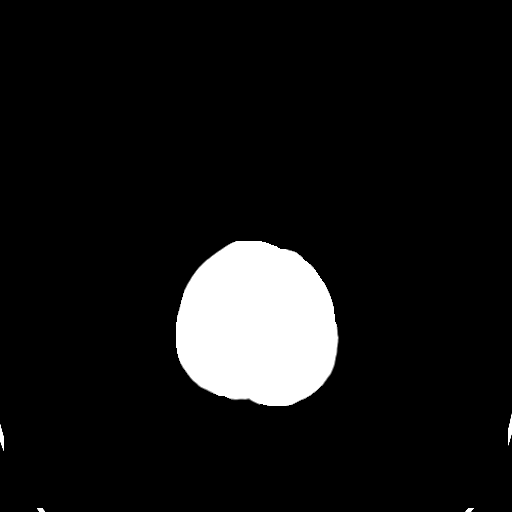

[Series 3: head bone · axial · 0.42mm/px · z∈[-229,-101]mm · 8 of 81 slices shown]
[im 9/81  bone]
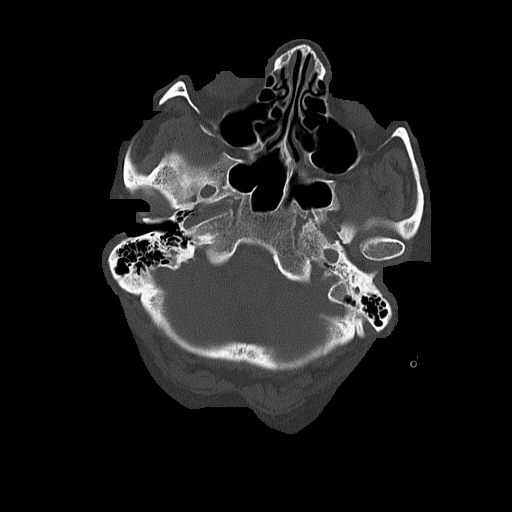
[im 17/81  bone]
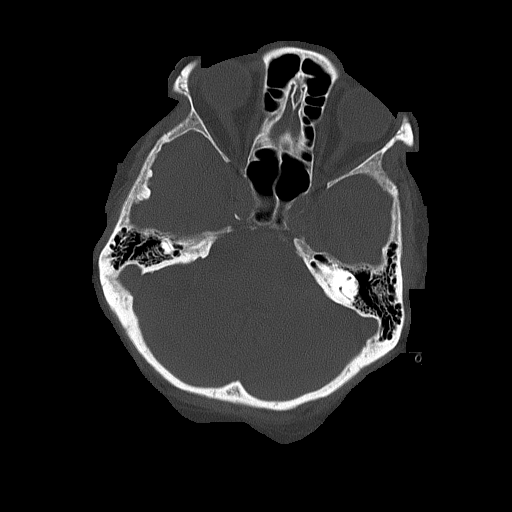
[im 25/81  bone]
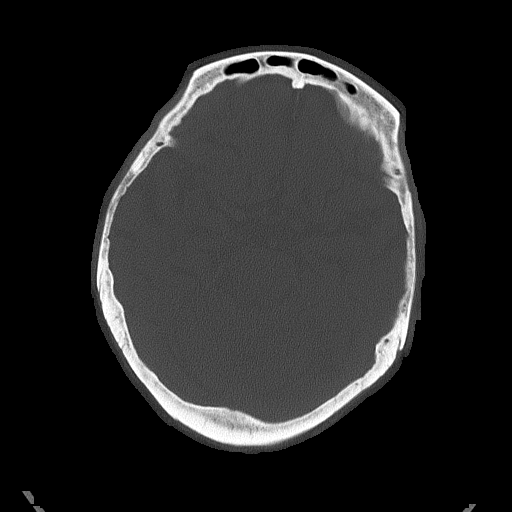
[im 37/81  bone]
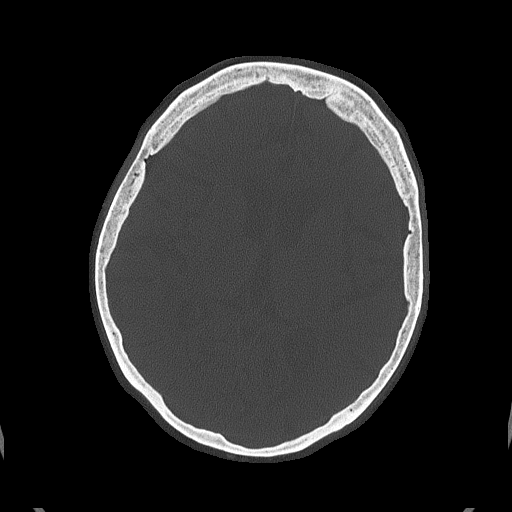
[im 45/81  bone]
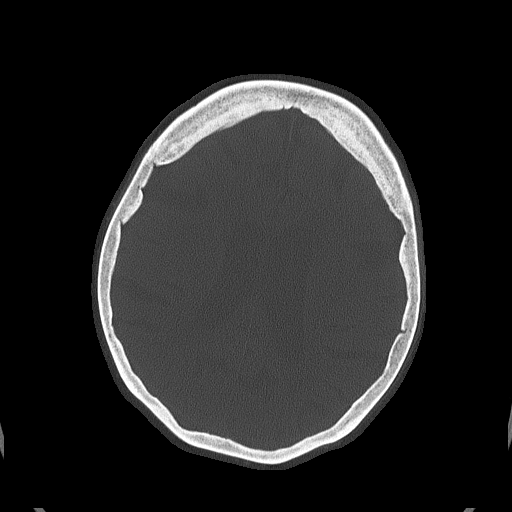
[im 57/81  bone]
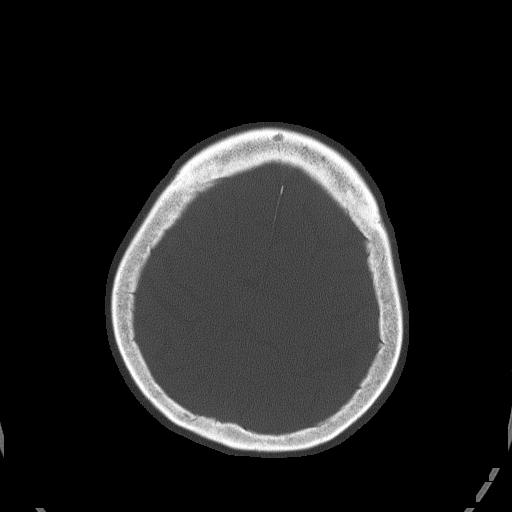
[im 65/81  bone]
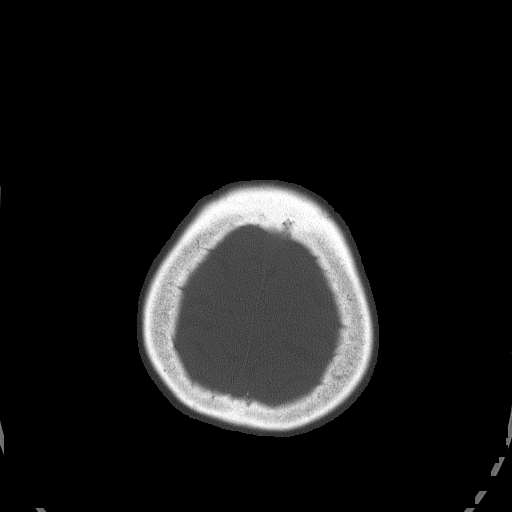
[im 73/81  bone]
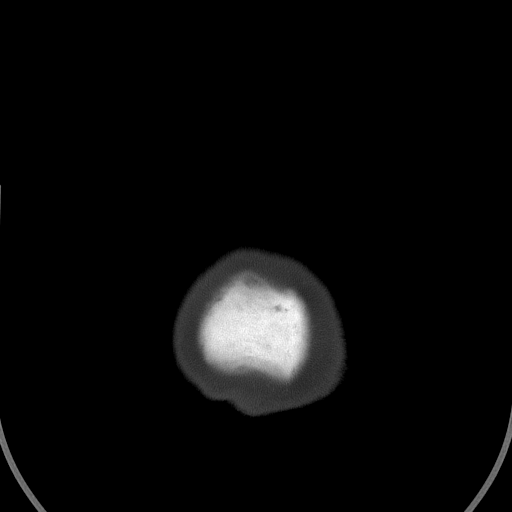

[15 of 30 positions shown; findings below may reference images not displayed]

FINDINGS: Diffuse cerebral atrophy. Mild ventricular dilatation consistent
with central atrophy. Low-attenuation changes in the deep white
matter consistent with small vessel ischemia. No mass effect or
midline shift. No abnormal extra-axial fluid collections. Gray-white
matter junctions are distinct. Basal cisterns are not effaced. No
evidence of acute intracranial hemorrhage. No depressed skull
fractures. Visualized paranasal sinuses and mastoid air cells are
not opacified. Vascular calcifications.
IMPRESSION: No acute intracranial abnormalities. Chronic atrophy and small
vessel ischemic changes.

## 2016-06-05 ENCOUNTER — Encounter: Payer: Self-pay | Admitting: Student

## 2017-11-07 ENCOUNTER — Telehealth: Payer: Self-pay | Admitting: Rheumatology

## 2017-11-07 NOTE — Telephone Encounter (Signed)
error
